# Patient Record
Sex: Female | Born: 1935 | Race: White | Hispanic: No | Smoking: Never smoker
Health system: Southern US, Community
[De-identification: ages and names within clinical notes are randomized; demographics above are authoritative.]

## PROBLEM LIST (undated history)

## (undated) DIAGNOSIS — E039 Hypothyroidism, unspecified: Secondary | ICD-10-CM

## (undated) DIAGNOSIS — C801 Malignant (primary) neoplasm, unspecified: Secondary | ICD-10-CM

## (undated) DIAGNOSIS — I10 Essential (primary) hypertension: Secondary | ICD-10-CM

## (undated) DIAGNOSIS — M1990 Unspecified osteoarthritis, unspecified site: Secondary | ICD-10-CM

## (undated) DIAGNOSIS — E785 Hyperlipidemia, unspecified: Secondary | ICD-10-CM

## (undated) DIAGNOSIS — Z8673 Personal history of transient ischemic attack (TIA), and cerebral infarction without residual deficits: Secondary | ICD-10-CM

## (undated) DIAGNOSIS — I639 Cerebral infarction, unspecified: Secondary | ICD-10-CM

## (undated) DIAGNOSIS — I499 Cardiac arrhythmia, unspecified: Secondary | ICD-10-CM

## (undated) DIAGNOSIS — E119 Type 2 diabetes mellitus without complications: Secondary | ICD-10-CM

## (undated) DIAGNOSIS — E079 Disorder of thyroid, unspecified: Secondary | ICD-10-CM

## (undated) DIAGNOSIS — Z923 Personal history of irradiation: Secondary | ICD-10-CM

## (undated) DIAGNOSIS — T451X5A Adverse effect of antineoplastic and immunosuppressive drugs, initial encounter: Secondary | ICD-10-CM

## (undated) DIAGNOSIS — N183 Chronic kidney disease, stage 3 unspecified: Secondary | ICD-10-CM

## (undated) HISTORY — DX: Malignant (primary) neoplasm, unspecified: C80.1

## (undated) HISTORY — DX: Essential (primary) hypertension: I10

## (undated) HISTORY — DX: Hyperlipidemia, unspecified: E78.5

## (undated) HISTORY — DX: Disorder of thyroid, unspecified: E07.9

## (undated) HISTORY — PX: SQUAMOUS CELL CARCINOMA EXCISION: SHX2433

## (undated) HISTORY — PX: MASTECTOMY: SHX3

## (undated) HISTORY — PX: CHOLECYSTECTOMY: SHX55

## (undated) HISTORY — DX: Type 2 diabetes mellitus without complications: E11.9

## (undated) HISTORY — PX: EYE SURGERY: SHX253

---

## 1998-02-26 ENCOUNTER — Other Ambulatory Visit: Admission: RE | Admit: 1998-02-26 | Discharge: 1998-02-26 | Payer: Self-pay | Admitting: Family Medicine

## 1999-03-14 ENCOUNTER — Other Ambulatory Visit: Admission: RE | Admit: 1999-03-14 | Discharge: 1999-03-14 | Payer: Self-pay | Admitting: Family Medicine

## 2000-04-27 ENCOUNTER — Other Ambulatory Visit: Admission: RE | Admit: 2000-04-27 | Discharge: 2000-04-27 | Payer: Self-pay | Admitting: Family Medicine

## 2001-05-07 ENCOUNTER — Other Ambulatory Visit: Admission: RE | Admit: 2001-05-07 | Discharge: 2001-05-07 | Payer: Self-pay | Admitting: Family Medicine

## 2001-05-31 ENCOUNTER — Ambulatory Visit (HOSPITAL_COMMUNITY): Admission: RE | Admit: 2001-05-31 | Discharge: 2001-05-31 | Payer: Self-pay | Admitting: Oncology

## 2001-05-31 ENCOUNTER — Encounter (HOSPITAL_COMMUNITY): Payer: Self-pay | Admitting: Oncology

## 2001-10-04 ENCOUNTER — Ambulatory Visit (HOSPITAL_COMMUNITY): Admission: RE | Admit: 2001-10-04 | Discharge: 2001-10-04 | Payer: Self-pay | Admitting: Gastroenterology

## 2002-05-19 ENCOUNTER — Other Ambulatory Visit: Admission: RE | Admit: 2002-05-19 | Discharge: 2002-05-19 | Payer: Self-pay | Admitting: Family Medicine

## 2003-05-20 ENCOUNTER — Ambulatory Visit (HOSPITAL_COMMUNITY): Admission: RE | Admit: 2003-05-20 | Discharge: 2003-05-20 | Payer: Self-pay | Admitting: Neurology

## 2003-06-15 ENCOUNTER — Ambulatory Visit (HOSPITAL_COMMUNITY): Admission: RE | Admit: 2003-06-15 | Discharge: 2003-06-15 | Payer: Self-pay | Admitting: Oncology

## 2003-07-23 ENCOUNTER — Other Ambulatory Visit: Admission: RE | Admit: 2003-07-23 | Discharge: 2003-07-23 | Payer: Self-pay | Admitting: Family Medicine

## 2004-01-11 ENCOUNTER — Encounter (HOSPITAL_COMMUNITY): Admission: RE | Admit: 2004-01-11 | Discharge: 2004-04-10 | Payer: Self-pay | Admitting: General Surgery

## 2004-01-25 ENCOUNTER — Ambulatory Visit (HOSPITAL_COMMUNITY): Admission: RE | Admit: 2004-01-25 | Discharge: 2004-01-25 | Payer: Self-pay | Admitting: Oncology

## 2004-01-27 ENCOUNTER — Ambulatory Visit: Admission: RE | Admit: 2004-01-27 | Discharge: 2004-01-27 | Payer: Self-pay | Admitting: Oncology

## 2004-02-04 ENCOUNTER — Encounter (INDEPENDENT_AMBULATORY_CARE_PROVIDER_SITE_OTHER): Payer: Self-pay | Admitting: *Deleted

## 2004-02-05 ENCOUNTER — Inpatient Hospital Stay (HOSPITAL_COMMUNITY): Admission: RE | Admit: 2004-02-05 | Discharge: 2004-02-06 | Payer: Self-pay | Admitting: General Surgery

## 2004-05-23 ENCOUNTER — Inpatient Hospital Stay (HOSPITAL_COMMUNITY): Admission: EM | Admit: 2004-05-23 | Discharge: 2004-05-26 | Payer: Self-pay | Admitting: Emergency Medicine

## 2004-06-01 ENCOUNTER — Ambulatory Visit: Admission: RE | Admit: 2004-06-01 | Discharge: 2004-08-04 | Payer: Self-pay | Admitting: Radiation Oncology

## 2004-06-06 ENCOUNTER — Ambulatory Visit (HOSPITAL_COMMUNITY): Admission: RE | Admit: 2004-06-06 | Discharge: 2004-06-06 | Payer: Self-pay | Admitting: Family Medicine

## 2004-06-28 ENCOUNTER — Encounter: Admission: RE | Admit: 2004-06-28 | Discharge: 2004-06-28 | Payer: Self-pay | Admitting: General Surgery

## 2004-07-12 ENCOUNTER — Other Ambulatory Visit: Admission: RE | Admit: 2004-07-12 | Discharge: 2004-07-12 | Payer: Self-pay | Admitting: General Surgery

## 2004-08-04 ENCOUNTER — Ambulatory Visit: Payer: Self-pay | Admitting: Oncology

## 2004-08-31 ENCOUNTER — Other Ambulatory Visit: Admission: RE | Admit: 2004-08-31 | Discharge: 2004-08-31 | Payer: Self-pay | Admitting: *Deleted

## 2004-09-01 ENCOUNTER — Ambulatory Visit: Admission: RE | Admit: 2004-09-01 | Discharge: 2004-09-01 | Payer: Self-pay | Admitting: Radiation Oncology

## 2004-09-19 ENCOUNTER — Ambulatory Visit: Payer: Self-pay | Admitting: Oncology

## 2004-09-26 ENCOUNTER — Ambulatory Visit (HOSPITAL_COMMUNITY): Admission: RE | Admit: 2004-09-26 | Discharge: 2004-09-26 | Payer: Self-pay | Admitting: Oncology

## 2004-11-11 ENCOUNTER — Ambulatory Visit: Payer: Self-pay | Admitting: Oncology

## 2005-01-11 ENCOUNTER — Ambulatory Visit: Payer: Self-pay | Admitting: Oncology

## 2005-03-17 ENCOUNTER — Ambulatory Visit: Payer: Self-pay | Admitting: Oncology

## 2005-06-16 ENCOUNTER — Ambulatory Visit: Payer: Self-pay | Admitting: Oncology

## 2005-09-15 ENCOUNTER — Ambulatory Visit: Payer: Self-pay | Admitting: Oncology

## 2005-09-15 ENCOUNTER — Other Ambulatory Visit: Admission: RE | Admit: 2005-09-15 | Discharge: 2005-09-15 | Payer: Self-pay | Admitting: Family Medicine

## 2005-09-18 ENCOUNTER — Ambulatory Visit (HOSPITAL_COMMUNITY): Admission: RE | Admit: 2005-09-18 | Discharge: 2005-09-18 | Payer: Self-pay | Admitting: Oncology

## 2005-09-28 ENCOUNTER — Ambulatory Visit (HOSPITAL_COMMUNITY): Admission: RE | Admit: 2005-09-28 | Discharge: 2005-09-28 | Payer: Self-pay | Admitting: Family Medicine

## 2005-12-15 ENCOUNTER — Ambulatory Visit: Payer: Self-pay | Admitting: Oncology

## 2005-12-19 LAB — CBC WITH DIFFERENTIAL/PLATELET
BASO%: 0.4 % (ref 0.0–2.0)
Basophils Absolute: 0 10*3/uL (ref 0.0–0.1)
EOS%: 3.7 % (ref 0.0–7.0)
Eosinophils Absolute: 0.2 10*3/uL (ref 0.0–0.5)
HCT: 33.4 % — ABNORMAL LOW (ref 34.8–46.6)
HGB: 11.6 g/dL (ref 11.6–15.9)
LYMPH%: 23 % (ref 14.0–48.0)
MCH: 31.4 pg (ref 26.0–34.0)
MCHC: 34.7 g/dL (ref 32.0–36.0)
MCV: 90.6 fL (ref 81.0–101.0)
MONO#: 0.7 10*3/uL (ref 0.1–0.9)
MONO%: 12.2 % (ref 0.0–13.0)
NEUT#: 3.3 10*3/uL (ref 1.5–6.5)
NEUT%: 60.7 % (ref 39.6–76.8)
Platelets: 304 10*3/uL (ref 145–400)
RBC: 3.69 10*6/uL — ABNORMAL LOW (ref 3.70–5.32)
RDW: 14.6 % — ABNORMAL HIGH (ref 11.3–14.5)
WBC: 5.5 10*3/uL (ref 3.9–10.0)
lymph#: 1.3 10*3/uL (ref 0.9–3.3)

## 2005-12-19 LAB — COMPREHENSIVE METABOLIC PANEL
ALT: 12 U/L (ref 0–40)
AST: 16 U/L (ref 0–37)
Albumin: 3.8 g/dL (ref 3.5–5.2)
Alkaline Phosphatase: 53 U/L (ref 39–117)
BUN: 16 mg/dL (ref 6–23)
CO2: 22 mEq/L (ref 19–32)
Calcium: 9 mg/dL (ref 8.4–10.5)
Chloride: 105 mEq/L (ref 96–112)
Creatinine, Ser: 1 mg/dL (ref 0.4–1.2)
Glucose, Bld: 81 mg/dL (ref 70–99)
Potassium: 4.3 mEq/L (ref 3.5–5.3)
Sodium: 145 mEq/L (ref 135–145)
Total Bilirubin: 0.4 mg/dL (ref 0.3–1.2)
Total Protein: 7 g/dL (ref 6.0–8.3)

## 2005-12-19 LAB — IRON AND TIBC
%SAT: 16 % — ABNORMAL LOW (ref 20–55)
Iron: 44 ug/dL (ref 42–145)
TIBC: 279 ug/dL (ref 250–470)
UIBC: 235 ug/dL

## 2005-12-19 LAB — LACTATE DEHYDROGENASE: LDH: 153 U/L (ref 94–250)

## 2005-12-19 LAB — FERRITIN: Ferritin: 40 ng/mL (ref 10–291)

## 2006-01-02 LAB — FECAL OCCULT BLOOD, GUAIAC: Occult Blood: NEGATIVE

## 2006-01-19 LAB — CBC WITH DIFFERENTIAL/PLATELET
BASO%: 0.8 % (ref 0.0–2.0)
Basophils Absolute: 0 10*3/uL (ref 0.0–0.1)
EOS%: 3.6 % (ref 0.0–7.0)
Eosinophils Absolute: 0.2 10*3/uL (ref 0.0–0.5)
HCT: 36.6 % (ref 34.8–46.6)
HGB: 12.4 g/dL (ref 11.6–15.9)
LYMPH%: 19.7 % (ref 14.0–48.0)
MCH: 30.6 pg (ref 26.0–34.0)
MCHC: 33.8 g/dL (ref 32.0–36.0)
MCV: 90.8 fL (ref 81.0–101.0)
MONO#: 0.7 10*3/uL (ref 0.1–0.9)
MONO%: 11.2 % (ref 0.0–13.0)
NEUT#: 4.2 10*3/uL (ref 1.5–6.5)
NEUT%: 64.7 % (ref 39.6–76.8)
Platelets: 301 10*3/uL (ref 145–400)
RBC: 4.04 10*6/uL (ref 3.70–5.32)
RDW: 14.6 % — ABNORMAL HIGH (ref 11.3–14.5)
WBC: 6.5 10*3/uL (ref 3.9–10.0)
lymph#: 1.3 10*3/uL (ref 0.9–3.3)

## 2006-02-28 ENCOUNTER — Encounter: Admission: RE | Admit: 2006-02-28 | Discharge: 2006-03-14 | Payer: Self-pay | Admitting: Oncology

## 2006-03-23 ENCOUNTER — Ambulatory Visit: Payer: Self-pay | Admitting: Oncology

## 2006-03-30 LAB — COMPREHENSIVE METABOLIC PANEL
ALT: 14 U/L (ref 0–40)
AST: 16 U/L (ref 0–37)
Albumin: 4.1 g/dL (ref 3.5–5.2)
Alkaline Phosphatase: 56 U/L (ref 39–117)
BUN: 19 mg/dL (ref 6–23)
CO2: 25 mEq/L (ref 19–32)
Calcium: 9.5 mg/dL (ref 8.4–10.5)
Chloride: 108 mEq/L (ref 96–112)
Creatinine, Ser: 0.95 mg/dL (ref 0.40–1.20)
Glucose, Bld: 82 mg/dL (ref 70–99)
Potassium: 4.8 mEq/L (ref 3.5–5.3)
Sodium: 142 mEq/L (ref 135–145)
Total Bilirubin: 0.5 mg/dL (ref 0.3–1.2)
Total Protein: 7 g/dL (ref 6.0–8.3)

## 2006-03-30 LAB — CBC WITH DIFFERENTIAL/PLATELET
BASO%: 0.5 % (ref 0.0–2.0)
Basophils Absolute: 0 10*3/uL (ref 0.0–0.1)
EOS%: 2.4 % (ref 0.0–7.0)
Eosinophils Absolute: 0.1 10*3/uL (ref 0.0–0.5)
HCT: 36.2 % (ref 34.8–46.6)
HGB: 12.5 g/dL (ref 11.6–15.9)
LYMPH%: 21.6 % (ref 14.0–48.0)
MCH: 31.8 pg (ref 26.0–34.0)
MCHC: 34.6 g/dL (ref 32.0–36.0)
MCV: 91.9 fL (ref 81.0–101.0)
MONO#: 0.7 10*3/uL (ref 0.1–0.9)
MONO%: 11 % (ref 0.0–13.0)
NEUT#: 4.1 10*3/uL (ref 1.5–6.5)
NEUT%: 64.5 % (ref 39.6–76.8)
Platelets: 282 10*3/uL (ref 145–400)
RBC: 3.94 10*6/uL (ref 3.70–5.32)
RDW: 14.6 % — ABNORMAL HIGH (ref 11.3–14.5)
WBC: 6.3 10*3/uL (ref 3.9–10.0)
lymph#: 1.4 10*3/uL (ref 0.9–3.3)

## 2006-03-30 LAB — IRON AND TIBC
%SAT: 29 % (ref 20–55)
Iron: 98 ug/dL (ref 42–145)
TIBC: 333 ug/dL (ref 250–470)
UIBC: 235 ug/dL

## 2006-03-30 LAB — FERRITIN: Ferritin: 71 ng/mL (ref 10–291)

## 2006-03-30 LAB — LACTATE DEHYDROGENASE: LDH: 125 U/L (ref 94–250)

## 2006-06-19 ENCOUNTER — Ambulatory Visit: Payer: Self-pay | Admitting: Oncology

## 2006-06-21 LAB — CBC WITH DIFFERENTIAL/PLATELET
BASO%: 0.2 % (ref 0.0–2.0)
Basophils Absolute: 0 10*3/uL (ref 0.0–0.1)
EOS%: 2.8 % (ref 0.0–7.0)
Eosinophils Absolute: 0.2 10*3/uL (ref 0.0–0.5)
HCT: 37.3 % (ref 34.8–46.6)
HGB: 12.9 g/dL (ref 11.6–15.9)
LYMPH%: 15.3 % (ref 14.0–48.0)
MCH: 32.2 pg (ref 26.0–34.0)
MCHC: 34.5 g/dL (ref 32.0–36.0)
MCV: 93.1 fL (ref 81.0–101.0)
MONO#: 0.8 10*3/uL (ref 0.1–0.9)
MONO%: 8.8 % (ref 0.0–13.0)
NEUT#: 6.4 10*3/uL (ref 1.5–6.5)
NEUT%: 72.9 % (ref 39.6–76.8)
Platelets: 323 10*3/uL (ref 145–400)
RBC: 4.01 10*6/uL (ref 3.70–5.32)
RDW: 13.6 % (ref 11.3–14.5)
WBC: 8.7 10*3/uL (ref 3.9–10.0)
lymph#: 1.3 10*3/uL (ref 0.9–3.3)

## 2006-06-21 LAB — COMPREHENSIVE METABOLIC PANEL WITH GFR
ALT: 13 U/L (ref 0–35)
AST: 16 U/L (ref 0–37)
Albumin: 3.9 g/dL (ref 3.5–5.2)
Alkaline Phosphatase: 55 U/L (ref 39–117)
BUN: 17 mg/dL (ref 6–23)
CO2: 28 meq/L (ref 19–32)
Calcium: 9.8 mg/dL (ref 8.4–10.5)
Chloride: 102 meq/L (ref 96–112)
Creatinine, Ser: 0.97 mg/dL (ref 0.40–1.20)
Glucose, Bld: 95 mg/dL (ref 70–99)
Potassium: 4 meq/L (ref 3.5–5.3)
Sodium: 138 meq/L (ref 135–145)
Total Bilirubin: 0.5 mg/dL (ref 0.3–1.2)
Total Protein: 7 g/dL (ref 6.0–8.3)

## 2006-06-21 LAB — LACTATE DEHYDROGENASE: LDH: 146 U/L (ref 94–250)

## 2006-07-30 ENCOUNTER — Emergency Department (HOSPITAL_COMMUNITY): Admission: EM | Admit: 2006-07-30 | Discharge: 2006-07-30 | Payer: Self-pay | Admitting: Emergency Medicine

## 2006-09-17 ENCOUNTER — Ambulatory Visit: Payer: Self-pay | Admitting: Oncology

## 2006-09-20 ENCOUNTER — Ambulatory Visit (HOSPITAL_COMMUNITY): Admission: RE | Admit: 2006-09-20 | Discharge: 2006-09-20 | Payer: Self-pay | Admitting: Oncology

## 2006-09-20 LAB — COMPREHENSIVE METABOLIC PANEL
ALT: 12 U/L (ref 0–35)
AST: 17 U/L (ref 0–37)
Albumin: 4 g/dL (ref 3.5–5.2)
Alkaline Phosphatase: 57 U/L (ref 39–117)
BUN: 13 mg/dL (ref 6–23)
CO2: 25 mEq/L (ref 19–32)
Calcium: 9.4 mg/dL (ref 8.4–10.5)
Chloride: 107 mEq/L (ref 96–112)
Creatinine, Ser: 0.96 mg/dL (ref 0.40–1.20)
Glucose, Bld: 140 mg/dL — ABNORMAL HIGH (ref 70–99)
Potassium: 3.9 mEq/L (ref 3.5–5.3)
Sodium: 141 mEq/L (ref 135–145)
Total Bilirubin: 0.5 mg/dL (ref 0.3–1.2)
Total Protein: 7 g/dL (ref 6.0–8.3)

## 2006-09-20 LAB — CBC WITH DIFFERENTIAL/PLATELET
BASO%: 0.7 % (ref 0.0–2.0)
Basophils Absolute: 0 10*3/uL (ref 0.0–0.1)
EOS%: 2.9 % (ref 0.0–7.0)
Eosinophils Absolute: 0.2 10*3/uL (ref 0.0–0.5)
HCT: 35.4 % (ref 34.8–46.6)
HGB: 12.7 g/dL (ref 11.6–15.9)
LYMPH%: 19.1 % (ref 14.0–48.0)
MCH: 32.1 pg (ref 26.0–34.0)
MCHC: 35.8 g/dL (ref 32.0–36.0)
MCV: 89.5 fL (ref 81.0–101.0)
MONO#: 0.6 10*3/uL (ref 0.1–0.9)
MONO%: 10.1 % (ref 0.0–13.0)
NEUT#: 4 10*3/uL (ref 1.5–6.5)
NEUT%: 67.2 % (ref 39.6–76.8)
Platelets: 313 10*3/uL (ref 145–400)
RBC: 3.95 10*6/uL (ref 3.70–5.32)
RDW: 13.4 % (ref 11.3–14.5)
WBC: 5.9 10*3/uL (ref 3.9–10.0)
lymph#: 1.1 10*3/uL (ref 0.9–3.3)

## 2006-09-20 LAB — LACTATE DEHYDROGENASE: LDH: 132 U/L (ref 94–250)

## 2006-12-17 ENCOUNTER — Ambulatory Visit: Payer: Self-pay | Admitting: Oncology

## 2006-12-19 LAB — CBC WITH DIFFERENTIAL/PLATELET
BASO%: 0.7 % (ref 0.0–2.0)
Basophils Absolute: 0 10*3/uL (ref 0.0–0.1)
EOS%: 2.8 % (ref 0.0–7.0)
Eosinophils Absolute: 0.2 10*3/uL (ref 0.0–0.5)
HCT: 34.4 % — ABNORMAL LOW (ref 34.8–46.6)
HGB: 12.1 g/dL (ref 11.6–15.9)
LYMPH%: 23 % (ref 14.0–48.0)
MCH: 31.4 pg (ref 26.0–34.0)
MCHC: 35.1 g/dL (ref 32.0–36.0)
MCV: 89.4 fL (ref 81.0–101.0)
MONO#: 0.5 10*3/uL (ref 0.1–0.9)
MONO%: 8.3 % (ref 0.0–13.0)
NEUT#: 4.3 10*3/uL (ref 1.5–6.5)
NEUT%: 65.2 % (ref 39.6–76.8)
Platelets: 272 10*3/uL (ref 145–400)
RBC: 3.84 10*6/uL (ref 3.70–5.32)
RDW: 13.9 % (ref 11.3–14.5)
WBC: 6.6 10*3/uL (ref 3.9–10.0)
lymph#: 1.5 10*3/uL (ref 0.9–3.3)

## 2006-12-19 LAB — COMPREHENSIVE METABOLIC PANEL
ALT: 11 U/L (ref 0–35)
AST: 15 U/L (ref 0–37)
Albumin: 4 g/dL (ref 3.5–5.2)
Alkaline Phosphatase: 60 U/L (ref 39–117)
BUN: 19 mg/dL (ref 6–23)
CO2: 24 mEq/L (ref 19–32)
Calcium: 9.2 mg/dL (ref 8.4–10.5)
Chloride: 108 mEq/L (ref 96–112)
Creatinine, Ser: 0.87 mg/dL (ref 0.40–1.20)
Glucose, Bld: 107 mg/dL — ABNORMAL HIGH (ref 70–99)
Potassium: 4.4 mEq/L (ref 3.5–5.3)
Sodium: 140 mEq/L (ref 135–145)
Total Bilirubin: 0.4 mg/dL (ref 0.3–1.2)
Total Protein: 6.8 g/dL (ref 6.0–8.3)

## 2006-12-19 LAB — LACTATE DEHYDROGENASE: LDH: 123 U/L (ref 94–250)

## 2007-03-07 ENCOUNTER — Ambulatory Visit: Payer: Self-pay | Admitting: Vascular Surgery

## 2007-03-18 ENCOUNTER — Ambulatory Visit: Payer: Self-pay | Admitting: Oncology

## 2007-03-21 LAB — CBC WITH DIFFERENTIAL/PLATELET
BASO%: 0.7 % (ref 0.0–2.0)
Basophils Absolute: 0.1 10*3/uL (ref 0.0–0.1)
EOS%: 3.4 % (ref 0.0–7.0)
Eosinophils Absolute: 0.2 10*3/uL (ref 0.0–0.5)
HCT: 33.6 % — ABNORMAL LOW (ref 34.8–46.6)
HGB: 12.1 g/dL (ref 11.6–15.9)
LYMPH%: 22.1 % (ref 14.0–48.0)
MCH: 32.3 pg (ref 26.0–34.0)
MCHC: 36.1 g/dL — ABNORMAL HIGH (ref 32.0–36.0)
MCV: 89.4 fL (ref 81.0–101.0)
MONO#: 0.6 10*3/uL (ref 0.1–0.9)
MONO%: 9.3 % (ref 0.0–13.0)
NEUT#: 4.4 10*3/uL (ref 1.5–6.5)
NEUT%: 64.5 % (ref 39.6–76.8)
Platelets: 302 10*3/uL (ref 145–400)
RBC: 3.76 10*6/uL (ref 3.70–5.32)
RDW: 14.1 % (ref 11.3–14.5)
WBC: 6.8 10*3/uL (ref 3.9–10.0)
lymph#: 1.5 10*3/uL (ref 0.9–3.3)

## 2007-03-21 LAB — COMPREHENSIVE METABOLIC PANEL
ALT: 13 U/L (ref 0–35)
AST: 18 U/L (ref 0–37)
Albumin: 4.1 g/dL (ref 3.5–5.2)
Alkaline Phosphatase: 54 U/L (ref 39–117)
BUN: 17 mg/dL (ref 6–23)
CO2: 25 mEq/L (ref 19–32)
Calcium: 9.5 mg/dL (ref 8.4–10.5)
Chloride: 107 mEq/L (ref 96–112)
Creatinine, Ser: 1 mg/dL (ref 0.40–1.20)
Glucose, Bld: 124 mg/dL — ABNORMAL HIGH (ref 70–99)
Potassium: 4.1 mEq/L (ref 3.5–5.3)
Sodium: 142 mEq/L (ref 135–145)
Total Bilirubin: 0.4 mg/dL (ref 0.3–1.2)
Total Protein: 6.9 g/dL (ref 6.0–8.3)

## 2007-03-21 LAB — LACTATE DEHYDROGENASE: LDH: 152 U/L (ref 94–250)

## 2007-06-04 ENCOUNTER — Ambulatory Visit: Payer: Self-pay | Admitting: Vascular Surgery

## 2007-06-17 ENCOUNTER — Ambulatory Visit: Payer: Self-pay | Admitting: Vascular Surgery

## 2007-06-24 ENCOUNTER — Ambulatory Visit: Payer: Self-pay | Admitting: Vascular Surgery

## 2007-07-19 ENCOUNTER — Ambulatory Visit: Payer: Self-pay | Admitting: Oncology

## 2007-07-23 LAB — COMPREHENSIVE METABOLIC PANEL
ALT: 13 U/L (ref 0–35)
AST: 16 U/L (ref 0–37)
Albumin: 4 g/dL (ref 3.5–5.2)
Alkaline Phosphatase: 66 U/L (ref 39–117)
BUN: 20 mg/dL (ref 6–23)
CO2: 23 mEq/L (ref 19–32)
Calcium: 9.5 mg/dL (ref 8.4–10.5)
Chloride: 107 mEq/L (ref 96–112)
Creatinine, Ser: 0.99 mg/dL (ref 0.40–1.20)
Glucose, Bld: 157 mg/dL — ABNORMAL HIGH (ref 70–99)
Potassium: 4.5 mEq/L (ref 3.5–5.3)
Sodium: 141 mEq/L (ref 135–145)
Total Bilirubin: 0.3 mg/dL (ref 0.3–1.2)
Total Protein: 7.2 g/dL (ref 6.0–8.3)

## 2007-07-23 LAB — CBC WITH DIFFERENTIAL/PLATELET
BASO%: 0.5 % (ref 0.0–2.0)
Basophils Absolute: 0 10*3/uL (ref 0.0–0.1)
EOS%: 2.3 % (ref 0.0–7.0)
Eosinophils Absolute: 0.2 10*3/uL (ref 0.0–0.5)
HCT: 35 % (ref 34.8–46.6)
HGB: 12.1 g/dL (ref 11.6–15.9)
LYMPH%: 22.4 % (ref 14.0–48.0)
MCH: 30.6 pg (ref 26.0–34.0)
MCHC: 34.5 g/dL (ref 32.0–36.0)
MCV: 88.8 fL (ref 81.0–101.0)
MONO#: 0.6 10*3/uL (ref 0.1–0.9)
MONO%: 7.6 % (ref 0.0–13.0)
NEUT#: 5.5 10*3/uL (ref 1.5–6.5)
NEUT%: 67.2 % (ref 39.6–76.8)
Platelets: 361 10*3/uL (ref 145–400)
RBC: 3.94 10*6/uL (ref 3.70–5.32)
RDW: 14.3 % (ref 11.3–14.5)
WBC: 8.1 10*3/uL (ref 3.9–10.0)
lymph#: 1.8 10*3/uL (ref 0.9–3.3)

## 2007-07-23 LAB — LACTATE DEHYDROGENASE: LDH: 136 U/L (ref 94–250)

## 2007-09-10 ENCOUNTER — Ambulatory Visit: Payer: Self-pay | Admitting: Vascular Surgery

## 2007-11-19 ENCOUNTER — Ambulatory Visit: Payer: Self-pay | Admitting: Oncology

## 2007-11-21 LAB — COMPREHENSIVE METABOLIC PANEL
ALT: 14 U/L (ref 0–35)
AST: 15 U/L (ref 0–37)
Albumin: 3.9 g/dL (ref 3.5–5.2)
Alkaline Phosphatase: 57 U/L (ref 39–117)
BUN: 11 mg/dL (ref 6–23)
CO2: 24 mEq/L (ref 19–32)
Calcium: 9 mg/dL (ref 8.4–10.5)
Chloride: 107 mEq/L (ref 96–112)
Creatinine, Ser: 1.1 mg/dL (ref 0.40–1.20)
Glucose, Bld: 171 mg/dL — ABNORMAL HIGH (ref 70–99)
Potassium: 3.9 mEq/L (ref 3.5–5.3)
Sodium: 141 mEq/L (ref 135–145)
Total Bilirubin: 0.4 mg/dL (ref 0.3–1.2)
Total Protein: 6.8 g/dL (ref 6.0–8.3)

## 2007-11-21 LAB — CBC WITH DIFFERENTIAL/PLATELET
BASO%: 0.2 % (ref 0.0–2.0)
Basophils Absolute: 0 10*3/uL (ref 0.0–0.1)
EOS%: 2 % (ref 0.0–7.0)
Eosinophils Absolute: 0.2 10*3/uL (ref 0.0–0.5)
HCT: 33.9 % — ABNORMAL LOW (ref 34.8–46.6)
HGB: 11.9 g/dL (ref 11.6–15.9)
LYMPH%: 17.7 % (ref 14.0–48.0)
MCH: 31.1 pg (ref 26.0–34.0)
MCHC: 35.3 g/dL (ref 32.0–36.0)
MCV: 88.3 fL (ref 81.0–101.0)
MONO#: 0.8 10*3/uL (ref 0.1–0.9)
MONO%: 8.4 % (ref 0.0–13.0)
NEUT#: 6.4 10*3/uL (ref 1.5–6.5)
NEUT%: 71.7 % (ref 39.6–76.8)
Platelets: 306 10*3/uL (ref 145–400)
RBC: 3.84 10*6/uL (ref 3.70–5.32)
RDW: 15.2 % — ABNORMAL HIGH (ref 11.3–14.5)
WBC: 9 10*3/uL (ref 3.9–10.0)
lymph#: 1.6 10*3/uL (ref 0.9–3.3)

## 2007-11-21 LAB — LACTATE DEHYDROGENASE: LDH: 142 U/L (ref 94–250)

## 2008-03-19 ENCOUNTER — Ambulatory Visit: Payer: Self-pay | Admitting: Oncology

## 2008-03-23 LAB — CBC WITH DIFFERENTIAL/PLATELET
BASO%: 1.1 % (ref 0.0–2.0)
Basophils Absolute: 0.1 10*3/uL (ref 0.0–0.1)
EOS%: 3.6 % (ref 0.0–7.0)
Eosinophils Absolute: 0.2 10*3/uL (ref 0.0–0.5)
HCT: 34.9 % (ref 34.8–46.6)
HGB: 12.2 g/dL (ref 11.6–15.9)
LYMPH%: 27.8 % (ref 14.0–48.0)
MCH: 31 pg (ref 26.0–34.0)
MCHC: 34.9 g/dL (ref 32.0–36.0)
MCV: 88.8 fL (ref 81.0–101.0)
MONO#: 0.6 10*3/uL (ref 0.1–0.9)
MONO%: 9.9 % (ref 0.0–13.0)
NEUT#: 3.7 10*3/uL (ref 1.5–6.5)
NEUT%: 57.6 % (ref 39.6–76.8)
Platelets: 260 10*3/uL (ref 145–400)
RBC: 3.93 10*6/uL (ref 3.70–5.32)
RDW: 14.5 % (ref 11.3–14.5)
WBC: 6.4 10*3/uL (ref 3.9–10.0)
lymph#: 1.8 10*3/uL (ref 0.9–3.3)

## 2008-03-23 LAB — COMPREHENSIVE METABOLIC PANEL
ALT: 12 U/L (ref 0–35)
AST: 15 U/L (ref 0–37)
Albumin: 4 g/dL (ref 3.5–5.2)
Alkaline Phosphatase: 66 U/L (ref 39–117)
BUN: 20 mg/dL (ref 6–23)
CO2: 22 mEq/L (ref 19–32)
Calcium: 9.6 mg/dL (ref 8.4–10.5)
Chloride: 107 mEq/L (ref 96–112)
Creatinine, Ser: 1.11 mg/dL (ref 0.40–1.20)
Glucose, Bld: 182 mg/dL — ABNORMAL HIGH (ref 70–99)
Potassium: 4 mEq/L (ref 3.5–5.3)
Sodium: 138 mEq/L (ref 135–145)
Total Bilirubin: 0.4 mg/dL (ref 0.3–1.2)
Total Protein: 7 g/dL (ref 6.0–8.3)

## 2008-03-23 LAB — LACTATE DEHYDROGENASE: LDH: 133 U/L (ref 94–250)

## 2008-07-21 ENCOUNTER — Ambulatory Visit: Payer: Self-pay | Admitting: Oncology

## 2008-07-23 LAB — CBC WITH DIFFERENTIAL/PLATELET
BASO%: 0.7 % (ref 0.0–2.0)
Basophils Absolute: 0 10*3/uL (ref 0.0–0.1)
EOS%: 2.9 % (ref 0.0–7.0)
Eosinophils Absolute: 0.2 10*3/uL (ref 0.0–0.5)
HCT: 36.6 % (ref 34.8–46.6)
HGB: 12.5 g/dL (ref 11.6–15.9)
LYMPH%: 31.6 % (ref 14.0–48.0)
MCH: 30.8 pg (ref 26.0–34.0)
MCHC: 34.2 g/dL (ref 32.0–36.0)
MCV: 90.1 fL (ref 81.0–101.0)
MONO#: 0.6 10*3/uL (ref 0.1–0.9)
MONO%: 9.3 % (ref 0.0–13.0)
NEUT#: 3.8 10*3/uL (ref 1.5–6.5)
NEUT%: 55.5 % (ref 39.6–76.8)
Platelets: 300 10*3/uL (ref 145–400)
RBC: 4.06 10*6/uL (ref 3.70–5.32)
RDW: 14.2 % (ref 11.3–14.5)
WBC: 6.9 10*3/uL (ref 3.9–10.0)
lymph#: 2.2 10*3/uL (ref 0.9–3.3)

## 2008-07-24 LAB — COMPREHENSIVE METABOLIC PANEL
ALT: 14 U/L (ref 0–35)
AST: 15 U/L (ref 0–37)
Albumin: 4.2 g/dL (ref 3.5–5.2)
Alkaline Phosphatase: 62 U/L (ref 39–117)
BUN: 20 mg/dL (ref 6–23)
CO2: 24 mEq/L (ref 19–32)
Calcium: 9.6 mg/dL (ref 8.4–10.5)
Chloride: 106 mEq/L (ref 96–112)
Creatinine, Ser: 1 mg/dL (ref 0.40–1.20)
Glucose, Bld: 160 mg/dL — ABNORMAL HIGH (ref 70–99)
Potassium: 4.1 mEq/L (ref 3.5–5.3)
Sodium: 140 mEq/L (ref 135–145)
Total Bilirubin: 0.4 mg/dL (ref 0.3–1.2)
Total Protein: 7.5 g/dL (ref 6.0–8.3)

## 2008-07-24 LAB — LACTATE DEHYDROGENASE: LDH: 142 U/L (ref 94–250)

## 2008-09-29 ENCOUNTER — Ambulatory Visit (HOSPITAL_COMMUNITY): Admission: RE | Admit: 2008-09-29 | Discharge: 2008-09-29 | Payer: Self-pay | Admitting: Oncology

## 2008-11-19 ENCOUNTER — Ambulatory Visit: Payer: Self-pay | Admitting: Oncology

## 2008-11-23 LAB — CBC WITH DIFFERENTIAL/PLATELET
BASO%: 0.6 % (ref 0.0–2.0)
Basophils Absolute: 0 10*3/uL (ref 0.0–0.1)
EOS%: 3.9 % (ref 0.0–7.0)
Eosinophils Absolute: 0.3 10*3/uL (ref 0.0–0.5)
HCT: 36 % (ref 34.8–46.6)
HGB: 12.3 g/dL (ref 11.6–15.9)
LYMPH%: 25.8 % (ref 14.0–49.7)
MCH: 30.6 pg (ref 25.1–34.0)
MCHC: 34.1 g/dL (ref 31.5–36.0)
MCV: 89.8 fL (ref 79.5–101.0)
MONO#: 0.6 10*3/uL (ref 0.1–0.9)
MONO%: 8.8 % (ref 0.0–14.0)
NEUT#: 4.4 10*3/uL (ref 1.5–6.5)
NEUT%: 60.9 % (ref 38.4–76.8)
Platelets: 275 10*3/uL (ref 145–400)
RBC: 4.01 10*6/uL (ref 3.70–5.45)
RDW: 14.5 % (ref 11.2–14.5)
WBC: 7.2 10*3/uL (ref 3.9–10.3)
lymph#: 1.9 10*3/uL (ref 0.9–3.3)

## 2008-11-23 LAB — COMPREHENSIVE METABOLIC PANEL
ALT: 10 U/L (ref 0–35)
AST: 14 U/L (ref 0–37)
Albumin: 3.9 g/dL (ref 3.5–5.2)
Alkaline Phosphatase: 59 U/L (ref 39–117)
BUN: 20 mg/dL (ref 6–23)
CO2: 24 mEq/L (ref 19–32)
Calcium: 9.8 mg/dL (ref 8.4–10.5)
Chloride: 106 mEq/L (ref 96–112)
Creatinine, Ser: 1.16 mg/dL (ref 0.40–1.20)
Glucose, Bld: 146 mg/dL — ABNORMAL HIGH (ref 70–99)
Potassium: 4.1 mEq/L (ref 3.5–5.3)
Sodium: 139 mEq/L (ref 135–145)
Total Bilirubin: 0.3 mg/dL (ref 0.3–1.2)
Total Protein: 7 g/dL (ref 6.0–8.3)

## 2008-11-23 LAB — LACTATE DEHYDROGENASE: LDH: 122 U/L (ref 94–250)

## 2009-03-23 ENCOUNTER — Ambulatory Visit: Payer: Self-pay | Admitting: Oncology

## 2009-03-25 LAB — COMPREHENSIVE METABOLIC PANEL
ALT: 11 U/L (ref 0–35)
AST: 14 U/L (ref 0–37)
Albumin: 4 g/dL (ref 3.5–5.2)
Alkaline Phosphatase: 56 U/L (ref 39–117)
BUN: 21 mg/dL (ref 6–23)
CO2: 23 mEq/L (ref 19–32)
Calcium: 9.3 mg/dL (ref 8.4–10.5)
Chloride: 106 mEq/L (ref 96–112)
Creatinine, Ser: 1.06 mg/dL (ref 0.40–1.20)
Glucose, Bld: 132 mg/dL — ABNORMAL HIGH (ref 70–99)
Potassium: 4.1 mEq/L (ref 3.5–5.3)
Sodium: 140 mEq/L (ref 135–145)
Total Bilirubin: 0.4 mg/dL (ref 0.3–1.2)
Total Protein: 6.9 g/dL (ref 6.0–8.3)

## 2009-03-25 LAB — CBC WITH DIFFERENTIAL/PLATELET
BASO%: 0.6 % (ref 0.0–2.0)
Basophils Absolute: 0 10*3/uL (ref 0.0–0.1)
EOS%: 4.1 % (ref 0.0–7.0)
Eosinophils Absolute: 0.3 10*3/uL (ref 0.0–0.5)
HCT: 35.4 % (ref 34.8–46.6)
HGB: 12.1 g/dL (ref 11.6–15.9)
LYMPH%: 23 % (ref 14.0–49.7)
MCH: 31.1 pg (ref 25.1–34.0)
MCHC: 34 g/dL (ref 31.5–36.0)
MCV: 91.4 fL (ref 79.5–101.0)
MONO#: 0.7 10*3/uL (ref 0.1–0.9)
MONO%: 9.3 % (ref 0.0–14.0)
NEUT#: 4.6 10*3/uL (ref 1.5–6.5)
NEUT%: 63 % (ref 38.4–76.8)
Platelets: 285 10*3/uL (ref 145–400)
RBC: 3.88 10*6/uL (ref 3.70–5.45)
RDW: 14.5 % (ref 11.2–14.5)
WBC: 7.3 10*3/uL (ref 3.9–10.3)
lymph#: 1.7 10*3/uL (ref 0.9–3.3)

## 2009-03-25 LAB — LACTATE DEHYDROGENASE: LDH: 129 U/L (ref 94–250)

## 2009-07-20 ENCOUNTER — Ambulatory Visit: Payer: Self-pay | Admitting: Oncology

## 2009-07-22 LAB — COMPREHENSIVE METABOLIC PANEL
ALT: 18 U/L (ref 0–35)
AST: 18 U/L (ref 0–37)
Albumin: 4.1 g/dL (ref 3.5–5.2)
Alkaline Phosphatase: 55 U/L (ref 39–117)
BUN: 20 mg/dL (ref 6–23)
CO2: 22 mEq/L (ref 19–32)
Calcium: 9.6 mg/dL (ref 8.4–10.5)
Chloride: 105 mEq/L (ref 96–112)
Creatinine, Ser: 1 mg/dL (ref 0.40–1.20)
Glucose, Bld: 95 mg/dL (ref 70–99)
Potassium: 4.3 mEq/L (ref 3.5–5.3)
Sodium: 138 mEq/L (ref 135–145)
Total Bilirubin: 0.4 mg/dL (ref 0.3–1.2)
Total Protein: 7.3 g/dL (ref 6.0–8.3)

## 2009-07-22 LAB — CBC WITH DIFFERENTIAL/PLATELET
BASO%: 0.7 % (ref 0.0–2.0)
Basophils Absolute: 0.1 10*3/uL (ref 0.0–0.1)
EOS%: 3.4 % (ref 0.0–7.0)
Eosinophils Absolute: 0.3 10*3/uL (ref 0.0–0.5)
HCT: 36 % (ref 34.8–46.6)
HGB: 12.2 g/dL (ref 11.6–15.9)
LYMPH%: 25.1 % (ref 14.0–49.7)
MCH: 31.3 pg (ref 25.1–34.0)
MCHC: 33.8 g/dL (ref 31.5–36.0)
MCV: 92.4 fL (ref 79.5–101.0)
MONO#: 0.6 10*3/uL (ref 0.1–0.9)
MONO%: 7.6 % (ref 0.0–14.0)
NEUT#: 5.1 10*3/uL (ref 1.5–6.5)
NEUT%: 63.2 % (ref 38.4–76.8)
Platelets: 330 10*3/uL (ref 145–400)
RBC: 3.9 10*6/uL (ref 3.70–5.45)
RDW: 14 % (ref 11.2–14.5)
WBC: 8.1 10*3/uL (ref 3.9–10.3)
lymph#: 2 10*3/uL (ref 0.9–3.3)

## 2009-07-22 LAB — LACTATE DEHYDROGENASE: LDH: 146 U/L (ref 94–250)

## 2009-09-30 ENCOUNTER — Ambulatory Visit (HOSPITAL_COMMUNITY): Admission: RE | Admit: 2009-09-30 | Discharge: 2009-09-30 | Payer: Self-pay | Admitting: Oncology

## 2009-11-17 ENCOUNTER — Ambulatory Visit: Payer: Self-pay | Admitting: Oncology

## 2009-11-19 LAB — CBC WITH DIFFERENTIAL/PLATELET
BASO%: 0.9 % (ref 0.0–2.0)
Basophils Absolute: 0.1 10*3/uL (ref 0.0–0.1)
EOS%: 3.4 % (ref 0.0–7.0)
Eosinophils Absolute: 0.2 10*3/uL (ref 0.0–0.5)
HCT: 36 % (ref 34.8–46.6)
HGB: 12.5 g/dL (ref 11.6–15.9)
LYMPH%: 24 % (ref 14.0–49.7)
MCH: 31.8 pg (ref 25.1–34.0)
MCHC: 34.7 g/dL (ref 31.5–36.0)
MCV: 91.7 fL (ref 79.5–101.0)
MONO#: 0.7 10*3/uL (ref 0.1–0.9)
MONO%: 9.9 % (ref 0.0–14.0)
NEUT#: 4.5 10*3/uL (ref 1.5–6.5)
NEUT%: 61.8 % (ref 38.4–76.8)
Platelets: 276 10*3/uL (ref 145–400)
RBC: 3.93 10*6/uL (ref 3.70–5.45)
RDW: 14 % (ref 11.2–14.5)
WBC: 7.2 10*3/uL (ref 3.9–10.3)
lymph#: 1.7 10*3/uL (ref 0.9–3.3)

## 2009-11-19 LAB — COMPREHENSIVE METABOLIC PANEL
ALT: 12 U/L (ref 0–35)
AST: 14 U/L (ref 0–37)
Albumin: 4.1 g/dL (ref 3.5–5.2)
Alkaline Phosphatase: 58 U/L (ref 39–117)
BUN: 15 mg/dL (ref 6–23)
CO2: 22 mEq/L (ref 19–32)
Calcium: 9 mg/dL (ref 8.4–10.5)
Chloride: 109 mEq/L (ref 96–112)
Creatinine, Ser: 1.15 mg/dL (ref 0.40–1.20)
Glucose, Bld: 132 mg/dL — ABNORMAL HIGH (ref 70–99)
Potassium: 4.2 mEq/L (ref 3.5–5.3)
Sodium: 142 mEq/L (ref 135–145)
Total Bilirubin: 0.4 mg/dL (ref 0.3–1.2)
Total Protein: 6.9 g/dL (ref 6.0–8.3)

## 2009-11-19 LAB — LACTATE DEHYDROGENASE: LDH: 129 U/L (ref 94–250)

## 2010-05-17 ENCOUNTER — Ambulatory Visit: Payer: Self-pay | Admitting: Oncology

## 2010-05-19 LAB — CBC WITH DIFFERENTIAL/PLATELET
BASO%: 0.6 % (ref 0.0–2.0)
Basophils Absolute: 0 10e3/uL (ref 0.0–0.1)
EOS%: 2.9 % (ref 0.0–7.0)
Eosinophils Absolute: 0.2 10e3/uL (ref 0.0–0.5)
HCT: 38 % (ref 34.8–46.6)
HGB: 13 g/dL (ref 11.6–15.9)
LYMPH%: 25.2 % (ref 14.0–49.7)
MCH: 31.4 pg (ref 25.1–34.0)
MCHC: 34.3 g/dL (ref 31.5–36.0)
MCV: 91.7 fL (ref 79.5–101.0)
MONO#: 0.8 10e3/uL (ref 0.1–0.9)
MONO%: 10.2 % (ref 0.0–14.0)
NEUT#: 4.9 10e3/uL (ref 1.5–6.5)
NEUT%: 61.1 % (ref 38.4–76.8)
Platelets: 266 10e3/uL (ref 145–400)
RBC: 4.14 10e6/uL (ref 3.70–5.45)
RDW: 13.8 % (ref 11.2–14.5)
WBC: 7.9 10e3/uL (ref 3.9–10.3)
lymph#: 2 10e3/uL (ref 0.9–3.3)

## 2010-05-19 LAB — COMPREHENSIVE METABOLIC PANEL WITH GFR
ALT: 11 U/L (ref 0–35)
AST: 17 U/L (ref 0–37)
Albumin: 4.2 g/dL (ref 3.5–5.2)
Alkaline Phosphatase: 62 U/L (ref 39–117)
BUN: 20 mg/dL (ref 6–23)
CO2: 25 meq/L (ref 19–32)
Calcium: 9.3 mg/dL (ref 8.4–10.5)
Chloride: 105 meq/L (ref 96–112)
Creatinine, Ser: 1.13 mg/dL (ref 0.40–1.20)
Glucose, Bld: 146 mg/dL — ABNORMAL HIGH (ref 70–99)
Potassium: 4.4 meq/L (ref 3.5–5.3)
Sodium: 140 meq/L (ref 135–145)
Total Bilirubin: 0.4 mg/dL (ref 0.3–1.2)
Total Protein: 7.2 g/dL (ref 6.0–8.3)

## 2010-05-19 LAB — LACTATE DEHYDROGENASE: LDH: 133 U/L (ref 94–250)

## 2010-11-18 ENCOUNTER — Other Ambulatory Visit (HOSPITAL_COMMUNITY): Payer: Self-pay | Admitting: Oncology

## 2010-11-18 ENCOUNTER — Encounter (HOSPITAL_BASED_OUTPATIENT_CLINIC_OR_DEPARTMENT_OTHER): Payer: Medicare Other | Admitting: Oncology

## 2010-11-18 ENCOUNTER — Ambulatory Visit (HOSPITAL_COMMUNITY)
Admission: RE | Admit: 2010-11-18 | Discharge: 2010-11-18 | Disposition: A | Discharge status: Home / Self Care | Payer: Medicare Other | Source: Ambulatory Visit | Attending: Oncology | Admitting: Oncology

## 2010-11-18 DIAGNOSIS — C50919 Malignant neoplasm of unspecified site of unspecified female breast: Secondary | ICD-10-CM

## 2010-11-18 DIAGNOSIS — Z170 Estrogen receptor positive status [ER+]: Secondary | ICD-10-CM

## 2010-11-18 DIAGNOSIS — M4180 Other forms of scoliosis, site unspecified: Secondary | ICD-10-CM | POA: Insufficient documentation

## 2010-11-18 DIAGNOSIS — Z9010 Acquired absence of unspecified breast and nipple: Secondary | ICD-10-CM | POA: Insufficient documentation

## 2010-11-18 LAB — COMPREHENSIVE METABOLIC PANEL
ALT: 13 U/L (ref 0–35)
AST: 19 U/L (ref 0–37)
Albumin: 4.2 g/dL (ref 3.5–5.2)
Alkaline Phosphatase: 60 U/L (ref 39–117)
BUN: 20 mg/dL (ref 6–23)
CO2: 25 mEq/L (ref 19–32)
Calcium: 9.8 mg/dL (ref 8.4–10.5)
Chloride: 105 mEq/L (ref 96–112)
Creatinine, Ser: 1.07 mg/dL (ref 0.40–1.20)
Glucose, Bld: 139 mg/dL — ABNORMAL HIGH (ref 70–99)
Potassium: 4.3 mEq/L (ref 3.5–5.3)
Sodium: 140 mEq/L (ref 135–145)
Total Bilirubin: 0.3 mg/dL (ref 0.3–1.2)
Total Protein: 7.2 g/dL (ref 6.0–8.3)

## 2010-11-18 LAB — CBC WITH DIFFERENTIAL/PLATELET
BASO%: 0.7 % (ref 0.0–2.0)
Basophils Absolute: 0.1 10*3/uL (ref 0.0–0.1)
EOS%: 3.1 % (ref 0.0–7.0)
Eosinophils Absolute: 0.2 10*3/uL (ref 0.0–0.5)
HCT: 38 % (ref 34.8–46.6)
HGB: 13 g/dL (ref 11.6–15.9)
LYMPH%: 27.7 % (ref 14.0–49.7)
MCH: 30.3 pg (ref 25.1–34.0)
MCHC: 34.2 g/dL (ref 31.5–36.0)
MCV: 88.6 fL (ref 79.5–101.0)
MONO#: 0.5 10*3/uL (ref 0.1–0.9)
MONO%: 6.5 % (ref 0.0–14.0)
NEUT#: 4.8 10*3/uL (ref 1.5–6.5)
NEUT%: 62 % (ref 38.4–76.8)
Platelets: 242 10*3/uL (ref 145–400)
RBC: 4.29 10*6/uL (ref 3.70–5.45)
RDW: 13.5 % (ref 11.2–14.5)
WBC: 7.7 10*3/uL (ref 3.9–10.3)
lymph#: 2.1 10*3/uL (ref 0.9–3.3)

## 2010-11-18 LAB — LACTATE DEHYDROGENASE: LDH: 142 U/L (ref 94–250)

## 2010-12-20 NOTE — Assessment & Plan Note (Signed)
OFFICE VISIT   NOTNAMED, CROUCHER  DOB:  03/18/1929                                       09/10/2007  CHART#:[id number]   The patient returns 3 months post laser ablation of her left greater  saphenous vein with multiple stab phlebectomies for painful varicosities  and distal edema.  She has had complete resolution of her symptoms in  the left leg, she states, having no throbbing, aching, or burning, and  not having to arise during the night because of pain.  She also has  noted no swelling in the left ankle and foot, and is not wearing elastic  compression stockings.  She has no history of deep vein thrombosis or  thrombophlebitis in the left leg.  She does have symptomatic  varicosities in the right calf, and the greater saphenous system,  particularly in the medial calf, which caused aching and throbbing  discomfort.  This has become more severe, but is not as bad as the left  leg was prior to her procedure.  She would like to follow this and see  how it does with conservative treatment.   EXAMINATION:  She has excellent femoral, popliteal, and dorsalis pedis  pulses bilaterally with well-perfused lower extremities.  There is no  distal edema in the left leg, and all the stab phlebectomy sites are  barely visible.  The right leg does have the prominent varicosity in the  greater saphenous system as noted with mild distal edema.  She will  return to see [place] on a p.r.n. basis if her symptoms become worse in the  right leg.   [name], [name]  Electronically Signed   JDL/MEDQ  D:  09/10/2007  T:  09/11/2007  Job:  [id number]

## 2010-12-20 NOTE — Assessment & Plan Note (Signed)
OFFICE VISIT   Morgan [name], [name]  DOB:  1929-02-17                                       06/04/2007  CHART#:[id number]   The patient has severe symptomatic varicose veins of the greater  saphenous system of the left leg with a history of bleeding on a few  episodes in the past.  She was evaluated in July of this year and has  attempted conservative measures, including elastic compression stockings  on a daily basis, analgesics, and elevation of the leg, but continues to  be symptomatic.  She has had no further bleeding episodes.  Her  discomfort is an aching, throbbing, burning discomfort in the thigh and  calf regions.   EXAMINATION:  Blood pressure 160/90, heart rate is 100, respirations 16.  She continues to have large bulbus varicosities in the distal thigh and  calf with large spider and reticular veins around the ankle area.  This  is where the bleeding occurred.  I think she would benefit from laser  ablation of the left greater saphenous vein with multiple stab  phlebectomies, and we will proceed with this in the near future since  she continues to be symptomatic despite conservative therapy.   [name], [name]  Electronically Signed   JDL/MEDQ  D:  06/04/2007  T:  06/05/2007  Job:  [id number]

## 2010-12-20 NOTE — Assessment & Plan Note (Signed)
OFFICE VISIT   Morgan, [name]  DOB:  [birth year]                                       03/07/2007  CHART#:[id number]   The patient is a 77 year old female who I have seen in the past for  possible TIA's in December of 2005.  She has had no recurrent symptoms  and was not found to have any significant carotid  disease at that time.  She has; however, developed progressively worse varicose veins in the  left leg and had a significant bleeding episode in December of 2007 with  severe enough bleeding that it required calling EMS to take her to the  emergency room and eventually, the bleeding subsided.  She has not had  further bleeding since that time.  She does have severe varicose veins  in the left leg she states and has had progressive swelling in her legs,  in both ankles with the left worse than the right, and has also had a  lot of aching, throbbing and burning discomfort in the left thigh and  calf.  This seems to have become progressively worse over the last  several months.  She is having no significant pain symptoms in the  contralateral right leg.  She has no history of deep venous thrombosis,  thrombophlebitis, or ulceration.  She is not wearing elastic compression  stockings or tried elevation of the legs to any great degree.  She does  not take pain medication on a regular basis.   PHYSICAL EXAMINATION:  She has excellent arterial pulses in the lower  extremities, 3+ in the femoral, popliteal and dorsalis pedis levels.  Left leg has severe greater saphenous varicosities beginning in the  medial and distal thigh extending into the medial calf.  She also has a  large cluster of spider veins in the ankle area, particularly medially.  The right leg has greater saphenous varicosities in the midcalf  extending down in the pretibial region.  There is also spider veins on  the right side in the ankle and calf area.  Left leg has 1+ edema,  slightly more  than the right side.  There is no hyperpigmentation or  stasis ulcers bilaterally.   A venous ultrasound was performed in the office today and this revealed  gross reflux at the saphenofemoral junction on the left with  incompetence of the left greater saphenous vein throughout.  The short  saphenous vein on the left is competent and there is no evidence of deep  venous obstruction.   I think she does have severely symptomatic varicose veins in the left  greater saphenous system and we will treat these with elastic  compression stockings, elevation, and pain medication to see if she can  gain symptomatic relief because these are effecting her daily living.  She will return in 3 months and see if she has had any improvement in  her symptoms.  If not, I think she would be a good candidate for laser  ablation of her left greater saphenous vein with excision of multiple  varicosities as an office based procedure.   [name], [name]  Electronically Signed   JDL/MEDQ  D:  03/07/2007  T:  03/08/2007  Job:  [id number]   cc:   [name], [name]

## 2010-12-20 NOTE — Assessment & Plan Note (Signed)
OFFICE VISIT   YASMYN, BELLISARIO  DOB:  1928/12/05                                       06/24/2007  CHART#:[id number]   The patient underwent laser ablation of her left greater saphenous vein  with multiple stab phlebectomies 1 week ago and has had an excellent  early result.  She has had minimal discomfort below the knee and some  mild to moderate discomfort along the course of the greater saphenous  vein in the thigh, which has almost resolved.  She has had some  bruising, but no distal edema.  She has been wearing her elastic  compression stocking during the day and taking her ibuprofen as  directed.   EXAM:  She has an excellent dorsalis pedis pulse in the left foot with  no distal edema.  I performed a limited venous duplex exam in the office  today, and there is no evidence of any deep vein thrombosis with normal  venous flow noted in the deep system.  The saphenous vein is occluded  from near the saphenofemoral junction to the knee.   She was reassured regarding these findings.  Return in 3 months for  final followup.   [name], [name]  Electronically Signed   JDL/MEDQ  D:  06/24/2007  T:  06/25/2007  Job:  [id number]

## 2010-12-20 NOTE — Procedures (Signed)
LOWER EXTREMITY VENOUS REFLUX EXAM   INDICATION:  Left leg varicose vein and pain.   EXAM:  Using color-flow imaging and pulse Doppler spectral analysis, the  left common femoral, superficial femoral, popliteal, posterior tibial,  greater and lesser saphenous veins are evaluated.  There is no evidence  suggesting deep venous insufficiency in the left lower extremity.   The left saphenofemoral junction is not competent.  The left GSV is not  competent with the caliber as described below.   The left proximal short saphenous vein demonstrates competency   GSV Diameter (used if found to be incompetent only)                                            Right    Left  Proximal Greater Saphenous Vein           cm       0.55 cm  Proximal-to-mid-thigh                     cm       0.55 cm  Mid thigh                                 cm       0.56 cm  Mid-distal thigh                          cm       0.56 cm  Distal thigh                              cm       0.54 cm  Knee                                      cm       0.41 cm   IMPRESSION:  1. Left greater saphenous vein reflux is identified with the caliber      ranging from 0.41 cm to 0.8 cm knee to groin.  2. The left greater saphenous vein is not aneurysmal.  3. The left greater saphenous vein is not tortuous.  4. There is no evidence of significant deep venous insufficiency in      the left lower extremity.  5. The deep venous system is competent.  6. The left lesser saphenous vein is competent.  7. No evidence of deep venous thrombosis noted in the left leg.   ___________________________________________  Quita Skye. Hart Rochester, [name]   MG/MEDQ  D:  03/07/2007  T:  03/08/2007  Job:  [id number]

## 2010-12-23 NOTE — Discharge Summary (Signed)
NAME:  ILIANA, [name] NO.:  [id number]   MEDICAL RECORD NO.:  [id number]          PATIENT TYPE:  INP   LOCATION:  3034                         FACILITY:  [facility]   PHYSICIAN:  Mallory Shirk, MD     DATE OF BIRTH:  11/29/1928   DATE OF ADMISSION:  05/23/2004  DATE OF DISCHARGE:  05/26/2004                                 DISCHARGE SUMMARY   DISCHARGE DIAGNOSES:  1.  Transient ischemic attack.  2.  Hypertension.  3.  Hypothyroidism.  4.  Leukocytosis.  5.  Diabetes mellitus.  6.  Tachycardia.   DISCHARGE MEDICATIONS:  1.  Aspirin 81 mg p.o. daily.  2.  Glipizide 5 mg p.o. daily.  3.  Synthroid 75 mcg p.o. daily.  4.  Lisinopril 5 mg p.o. daily.  5.  Metformin 500 mg p.o. b.i.d.  6.  Metoprolol 12.5 mg p.o. b.i.d.  7.  K-Dur 20 mEq one tablet p.o. for five days, then stop.   HISTORY OF PRESENT ILLNESS:  Morgan [name] is a very pleasant 77 year old  Caucasian woman with a history of breast cancer status post mastectomy of  her left breast.  Came in with chief complaint of transient neurologic  symptoms.  Patient had undergone a __________ mastectomy in June of 2005 and  is status post chemotherapy, the fifth of six treatments being the Wednesday  before admission, with anticipated radiation therapy to follow.  She has  also been receiving Taxotere and Cytoxan.  On the morning of admission she  was unable to provide a time frame.  Developed incontinence which was new  for her.  Has never had problems with this before.  Also had bilateral loss  of arm coordination.  She also had a generalized feeling of unwellness.  She  was in her recliner chair at home and called out to her dog.  However, she  describes the symptoms of aphasia.  Legs were weak.  She subsequently  slipped to the floor, scrambled to call her doctor on the phone who  dispatched EMS.  Upon EMS arrival her symptoms had resolved.  Presented to  the emergency room completely asymptomatic.   PAST  MEDICAL HISTORY:  1.  Breast cancer in the left breast 1989 status post mastectomy with a      newly diagnosed cancer in 2005 on chemotherapy.  2.  Diabetes mellitus type 2.  3.  Hypertension.  4.  Hypothyroidism.  5.  Status post cholecystectomy.   MEDICATIONS ON ADMISSION:  1.  Aspirin.  2.  Metformin 50 mg p.o. b.i.d.  3.  Lisinopril 5 mg p.o. daily.  4.  Synthroid 0.075 mg p.o. daily.  5.  Dexamethasone 4 mg prior to chemotherapy and 4 mg post chemotherapy      treatments.  6.  Hydrocodone p.r.n.  7.  Tylenol p.r.n.  8.  Glucotrol XL 10 mg p.o. daily.  9.  Prochlorperazine 10 mg p.o. daily.   ALLERGIES:  Patient has no known drug allergies.   PHYSICAL EXAMINATION:  VITAL SIGNS:  Blood pressure 136/65, temperature  97.4, O2 saturations 100% on room  air.  GENERAL:  Patient was alert and oriented, in no acute distress, answering  questions appropriately.  No evidence of confusion or disorientation.  HEENT:  Normocephalic, atraumatic.  Extraocular muscles intact.  PERRL.  Oropharynx non-erythematous.  NECK:  Supple.  No lymphadenopathy.  No thyromegaly.  CVS:  S1 plus S2.  No murmurs, rubs, or gallops.  No carotid bruits.  LUNGS:  Clear to auscultation bilaterally.  No dullness to percussion.  No  rales.  ABDOMEN:  Positive bowel sounds, soft, nontender.  No masses.  EXTREMITIES:  No clubbing, cyanosis, edema.  NEUROLOGIC:  Nonfocal.   LABORATORY DATA:  On admission WBC 17.8, hemoglobin 9.9, platelet count  343,000, MCV 89.0.  Sodium 139, potassium 4.6, BUN 20, creatinine 1.0,  glucose 61.  EKG showed sinus brady and then sinus tachy.   CT scan of the head:  No acute intracranial process.   HOSPITAL COURSE:  Patient was admitted to a monitor bed.   #1 - TRANSIENT ISCHEMIC ATTACK:  CT head:  No acute intracranial process.  MRA head:  No high grade stenosis of the major intracranial vessels.  Left  vertebral artery is dominant.  Mild ectasia of the vertebrobasilar  system.  No aneurysms detected.  MRI of the brain:  No acute infarct or abnormal  intracranial enhancing lesion.   Patient had no symptoms during her stay.  She will be discharged on aspirin.   #2 - HYPERTENSION:  Patient's blood pressure was well controlled during her  hospital stay.  She is on metoprolol 12.5 mg p.o. daily.  This would also  help with the tachycardia which was persistent throughout the hospital stay.  On day of discharge her pulse was 109.   #3 - HYPOTHYROIDISM:  Patient is on Synthroid at 75 mcg daily.  Her TSH and  T3, T4 were within normal limits.  We have continued her Synthroid in spite  of her tachycardia.  PCP, Dr. [name], will reevaluate  medications next week.   #4 - DIABETES MELLITUS:  Well controlled on Glipizide.   #5 - LEUKOCYTOSIS:  Upon admission patient's WBCs were 17.8.  This probably  was secondary to the dexamethasone she had received prior to chemotherapy.  She had no fever or any signs of infection during the hospital stay.  On the  day of discharge WBCs were 9.3.   #6 - HYPOKALEMIA:  Patient's potassium was 3.4 on the day of discharge.  She  was given a 40 mEq p.o. supplement and will be discharged with 20 mEq daily  x5 days.  Dr. Smith Mince will check a BMP on October 25 to check potassium  again.   FOLLOWUP APPOINTMENTS:  With Dr. [name] on May 31, 2004 at  9:15 a.m. at which time a BMP will be drawn to check potassium levels.  All  medications will be reviewed with Dr. Smith Mince at that time.   Patient was advised to limit her activity on an as tolerated basis.  She was  also advised to return to the emergency room immediately upon onset of  dizziness, shortness of breath, or any other symptom that may need immediate  medical attention.       GDK/MEDQ  D:  05/26/2004  T:  05/26/2004  Job:  [id number]

## 2010-12-23 NOTE — Op Note (Signed)
NAME:  Morgan [name], Morgan [name] NO.:  [id number]   MEDICAL RECORD NO.:  [id number]                   PATIENT TYPE:  OBV   LOCATION:  0443                                 [facility]ACILITY:  [facility]   PHYSICIAN:  Angelia Mould. Derrell Lolling, [name]             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   OPERATION/PROCEDURE:  Right modified radical mastectomy.   SURGEON:  Angelia Mould. Derrell Lolling, [name]   FIRST ASSISTANT:  Ollen Gross. Carolynne Edouard, [name]   OPERATIVE INDICATIONS:  This is a 77 year old woman who underwent left  modified radical mastectomy in 1989.  She had multiple positive nodes and  received postoperative adjuvant radiation therapy.  She has had no known  recurrence to date.  She recently was found to have palpable right axillary  lymphadenopathy.  Mammogram, ultrasound, and MRI were all performed.  She  has at least three abnormal right axillary lymph nodes seen but no  abnormality was noted in the right breast.  We cannot palpate any mass in  the breast, but there are multiple palpable mobile lymph nodes in the right  axilla.  She had a core biopsy of one of these lymph nodes which shows  breast cancer.  Her case has been discussed in breast conference and with  Dr. Arline Asp.  We have elected to go ahead with a modified radical  mastectomy.   OPERATIVE TECHNIQUE:  Following the induction of general endotracheal  anesthesia, the patient's right chest and upper arm and axilla were prepped  and draped in a sterile fashion.  A transverse elliptical incision was made  in the right  breast, encompassing the nipple and areola.  Skin flaps were  raised superiorly to just below the clavicle, medially to the sternal  border, inferiorly to the anterior rectus sheath, and laterally to the  latissimus dorsi muscle.  The breast was dissected away from the underlying  pectoralis major muscle with electrocautery.  Dissection was carried  laterally, dissecting the breast away from the lateral border of the  pectoralis major muscle and ultimately away from the lateral border of the  pectoralis minor muscle.  We incised the clavipectoral fascia and entered  the axilla.  It was apparent that there were multiple enlarged lymph nodes,  some as small as 1 cm but one was almost 3 cm in size.  These were not  matted.  They were individual and mobile.  There was no fixation to nerve or  blood vessel.  We dissected out all level 1 and level 2 lymph nodes  and  removed all palpable lymph nodes.  We identified the long thoracic nerve,  and it was preserved and functioning at the end of the case.  We identified  the thoracodorsal neurovascular bundle and preserved that, and the  thoracodorsal nerve was functioning at the end of the case.  Some small  venous tributaries of the axillary vein were controlled with multiple metal  clips and divided.  Intercostal brachial cutaneous nerve was controlled  between metal clips and divided.  This specimen was ultimately removed  intact.  The breast and axilla were copiously irrigated with 2 liters of  saline.  Hemostasis was excellent.  We checked the functioning of the long  thoracic nerve and thoracodorsal nerve one more time, and everything seemed  fine with good function of the muscle in response to compression.  Two 19  French round Blake drains were placed, one up into the axilla and one across  the skin flaps. These were brought out through separate stab incisions in  the right lateral chest wall.  Sutured to the skin with nylon sutures and   connected to suction bulb.  The skin was closed with  skin staples and an occlusive dressing was placed.  The patient tolerated  the procedure well and was taken to the recovery room in stable condition.  Estimated blood loss was about 150 cc.  Complications were none.  Sponge,  needle, and instrument counts were correct.                                               Angelia Mould. Derrell Lolling, [name]    HMI/MEDQ  D:  02/04/2004  T:  02/04/2004  Job:  [id number]   cc:   [name], [name]  [address]  [place]  [place] [address]  Fax: [phone]   [name], [name]  [address] [facility]  [place]  [place] [address]  Fax: [phone]

## 2010-12-23 NOTE — H&P (Signed)
NAME:  Morgan [name], FALLAS NO.:  [id number]   MEDICAL RECORD NO.:  [id number]          PATIENT TYPE:  INP   LOCATION:  3034                         FACILITY:  [facility]   PHYSICIAN:  Hettie Holstein, D.O.    DATE OF BIRTH:  [birth year]   DATE OF ADMISSION:  05/23/2004  DATE OF DISCHARGE:                                HISTORY & PHYSICAL   PRIMARY CARE PHYSICIAN:  [name], [name]   ONCOLOGIST:  [name], [name]   CHIEF COMPLAINT:  Transient neurologic symptoms.   HISTORY OF PRESENT ILLNESS:  The patient is a pleasant 77 year old  independently living female who has had an extensive past medical history  with regards to breast cancer in 1989.  She had a mastectomy of her left  breast due to an inflammatory cancer which she has done quite well with up  until June of this year at which time she and her primary care physician  discovered a right breast cancer.  She has undergone a right radical  mastectomy in June of 2005 and has undergone chemotherapy, the fifth of  sixth treatments being this past Wednesday with anticipated radiation  therapy to follow.  She had been receiving Taxotere and Cytoxan. In any  event, this morning she cannot provide a time frame; however, she stated  that she developed incontinence which was new for her.  She has never had  problems with this before and she also had bilateral loss of arm  coordination.  She had a generalized unwell feeling. She was in her recliner  chair at home and called out to her dog.  However, she describes symptoms of  aphasia.  Her legs were quite weak.  She subsequently slid to the floor and  scrambled to call her daughter on the phone who dispatched 911.  Upon EMS  arrival, her symptoms had resolved.  She presented to the emergency  department asymptomatic.  A non-contrast CT scan of her brain was  unremarkable and she is being admitted for further evaluation and management  including further imaging  studies.   I have discussed this finding with her oncologist, Dr. Arline Asp, and it is  not felt that a course of chemotherapy are related to these findings.   PAST MEDICAL HISTORY:  1.  Breast cancer of her left breast in 1989 status post mastectomy and a      new diagnosed cancer in June of 2005.  Her last chemotherapy was this      past Wednesday with one more anticipated.  2.  History of non-insulin dependent diabetes.  3.  Hypertension.  4.  Thyroid disease.  5.  Status post cholecystectomy.   MEDICATIONS:  1.  Aspirin daily.  2.  Metformin 500 mg p.o. b.i.d.  3.  Lisinopril 5 mg daily.  4.  Synthroid 0.075 mg daily.  5.  Dexamethasone 4 mg the day preceding chemotherapy and 4 mg a day post      chemotherapy treatments.  6.  Hydrocodone 500 mg.  7.  Tylenol p.r.n.  8.  Glucotrol XL 10 mg daily.  9.  Prochlorperazine 10 mg a day.  10. The patient has been receiving Aranesp for the last two weeks in      addition to Neulasta following each of her chemotherapy treatments.   ALLERGIES:  No known drug allergies.   SOCIAL HISTORY:  The patient currently lives alone. She is a lifetime non-  smoker.  She only drinks occasional wine.  She lives about 15 minutes away  from her only daughter.  She is divorced from her husband who is now  deceased.   FAMILY HISTORY:  Significant for a mother who had breast cancer and  underwent a mastectomy at age 56.  Father had heart problems; the details  are not further available or known to the patient.   REVIEW OF SYMPTOMS:  GENERAL:  The patient has been in her usual state of  health.  She has had some weakness after her chemotherapy treatments.  GI:  She has had no nausea or vomiting.  She has had some diarrhea.  No abdominal  pain.  CARDIOPULMONARY:  No chest pain, shortness of breath or swelling in  her lower extremities.  In any event, she did not have any symptoms such as  these before in the past.  She has no hematochezia, melena,  coffee ground  emesis.  GU:  No dysuria with the exception of this episode of incontinence  which she says is not typical of her.  Otherwise, further review of systems  is unremarkable.   PHYSICAL EXAMINATION:  VITAL SIGNS:  Blood pressure is 136/65, temperature  97.4, 02 saturation on room air is 100%.  GENERAL:  The patient is alert, pleasant and in no acute distress, answering  questions appropriately, exhibiting no evidence of confusion or  disorientation.  HEENT:  The head is normocephalic, atraumatic.  Extraocular muscles are  intact.  Pupils equal, round and reactive to light and accommodation.  Examination of her mouth reveals no evidence of thrush. Her neck is supple  and non-tender.  There is no palpable mass or thyromegaly.  CARDIOVASCULAR:  Normal S1 and S2 without appreciable murmur or gallop. No  evidence of carotid bruits.  LUNGS:  Clear lung sounds bilaterally. There is no dullness to percussion  and she exhibited normal effort of respirations.  ABDOMEN:  Soft and non-tender.  No palpable mass.  EXTREMITIES:  No cyanosis, clubbing or edema.  NEUROLOGIC EXAM:  +5 in all four extremities and no focal neurologic  deficits.  Deep tendon reflexes were 2/4 times four and she was euthymic and  her affect was stable.   LABORATORY DATA:  WBC 17.8, hemoglobin 9.9, platelet count 343,000 and MCV  89.0.  Sodium 139, potassium 4.6, BUN 20, creatinine 1.0, glucose 61.   EKG revealed initially sinus bradycardia, then sinus tachycardia.  CT scan  of her head was not revealing of an acute event.   IMPRESSION:  1.  Transient neurologic episode.  2.  Breast cancer times two.  3.  Currently under chemotherapy with Cytoxan and Taxotere with anticipation      for radiation therapy to follow.  4.  Diabetes mellitus with a blood glucose of 61 on ED arrival and when      checked in the field found to be borderline but greater than 60.  5.  Hypertension.  PLAN:  At this time, we are  going to admit the patient for further  evaluation, management, check MRI and MRA studies, rule out acute infarct,  follow on the neuro floor and follow  her blood sugars closely. There is a  possibility that this could be explained by hypoglycemia if no other  etiology is apparent.  We will follow her CBG's on the floor and half her  dose of Glucotrol for now. We will check a hemoglobin A1c to assess degree  of control and continue her medications as she was at home.       ESS/MEDQ  D:  05/23/2004  T:  05/23/2004  Job:  [id number]   cc:   [name], [name]  [address]  [place]  [place] [address]  Fax: [phone]   [name], [name]  [address] [facility]  [place]  [place] [address]  Fax: [phone]

## 2010-12-23 NOTE — Procedures (Signed)
[facility]  Patient:    Morgan [name], Morgan [name] Visit Number: [id number] MRN: [id number]          Service Type: END Location: ENDO Attending Physician:  Rich Brave Proc. Date: 10/04/01 Admit Date:  10/04/2001   CC:         [name], [name]  [name], [name]   Procedure Report  PROCEDURE PERFORMED:  Colonoscopy.  ENDOSCOPIST:  Florencia Reasons, [name]  INDICATION:  Mrs. Inch is a 77 year old female for colon cancer screening. She has a personal remote history of breast cancer.  She does not have any worrisome GI tract symptoms at this time.  FINDINGS:  Extensive pancolic diverticulosis, otherwise normal exam.  DESCRIPTION OF PROCEDURE:  The nature, purpose, and risks of the procedure had been discussed with the patient who had provided written consent.  Sedation with fentanyl 100 mcg and Versed 10 mg IV without arrhythmias or desaturation. The Olympus adjustable tension pediatric video colonoscope was advanced with some looping that had to be overcome by having the patient in the supine position and applying external abdominal compression.  Ultimately, I believe we reached the cecum as evidenced by the absence of further lumen, and what appeared to be the appendiceal orifice, although there was not transillumination of the right lower quadrant (not surprising, since she is obese) and I really could not define a definite ileocecal valve.  Overall, however, I am quite confident this was the cecum based on its appearance, and the depth of scope insertion.  Pullback was then performed.  The quality of the prep was very good, and it is felt that all areas were well seen.  There was moderately extensive pancolic diverticulosis.  However, no polyps, cancer, colitis, or vascular malformations were seen.  Retroflexion was not performed due to a small rectal ampulla but antegrade viewing disclosed no distal rectal lesions and  reinspection of the rectosigmoid disclosed no additional findings.  No biopsies were obtained. The patient tolerated the procedure well and there were no apparent complications.  IMPRESSION:  Normal exam except for extensive pancolic diverticulosis.  PLAN:  Consider screening flexible sigmoidoscopy in five years. Attending Physician:  Rich Brave DD:  10/04/01 TD:  10/05/01 Job: [id number]

## 2010-12-28 ENCOUNTER — Encounter (INDEPENDENT_AMBULATORY_CARE_PROVIDER_SITE_OTHER): Payer: Self-pay | Admitting: General Surgery

## 2011-05-11 ENCOUNTER — Encounter (HOSPITAL_BASED_OUTPATIENT_CLINIC_OR_DEPARTMENT_OTHER): Payer: Medicare Other | Admitting: Oncology

## 2011-05-11 ENCOUNTER — Other Ambulatory Visit (HOSPITAL_COMMUNITY): Payer: Self-pay | Admitting: Oncology

## 2011-05-11 DIAGNOSIS — Z170 Estrogen receptor positive status [ER+]: Secondary | ICD-10-CM

## 2011-05-11 DIAGNOSIS — C50919 Malignant neoplasm of unspecified site of unspecified female breast: Secondary | ICD-10-CM

## 2011-05-11 LAB — CBC WITH DIFFERENTIAL/PLATELET
BASO%: 0.6 % (ref 0.0–2.0)
Basophils Absolute: 0 10*3/uL (ref 0.0–0.1)
EOS%: 3.1 % (ref 0.0–7.0)
Eosinophils Absolute: 0.2 10*3/uL (ref 0.0–0.5)
HCT: 37.8 % (ref 34.8–46.6)
HGB: 13.1 g/dL (ref 11.6–15.9)
LYMPH%: 28.8 % (ref 14.0–49.7)
MCH: 31.6 pg (ref 25.1–34.0)
MCHC: 34.7 g/dL (ref 31.5–36.0)
MCV: 91.2 fL (ref 79.5–101.0)
MONO#: 0.7 10*3/uL (ref 0.1–0.9)
MONO%: 9.3 % (ref 0.0–14.0)
NEUT#: 4.5 10*3/uL (ref 1.5–6.5)
NEUT%: 58.2 % (ref 38.4–76.8)
Platelets: 276 10*3/uL (ref 145–400)
RBC: 4.14 10*6/uL (ref 3.70–5.45)
RDW: 13.6 % (ref 11.2–14.5)
WBC: 7.7 10*3/uL (ref 3.9–10.3)
lymph#: 2.2 10*3/uL (ref 0.9–3.3)

## 2011-05-11 LAB — COMPREHENSIVE METABOLIC PANEL
ALT: 14 U/L (ref 0–35)
AST: 19 U/L (ref 0–37)
Albumin: 4.2 g/dL (ref 3.5–5.2)
Alkaline Phosphatase: 58 U/L (ref 39–117)
BUN: 19 mg/dL (ref 6–23)
CO2: 24 mEq/L (ref 19–32)
Calcium: 9.4 mg/dL (ref 8.4–10.5)
Chloride: 106 mEq/L (ref 96–112)
Creatinine, Ser: 1.13 mg/dL — ABNORMAL HIGH (ref 0.50–1.10)
Glucose, Bld: 101 mg/dL — ABNORMAL HIGH (ref 70–99)
Potassium: 4.2 mEq/L (ref 3.5–5.3)
Sodium: 140 mEq/L (ref 135–145)
Total Bilirubin: 0.5 mg/dL (ref 0.3–1.2)
Total Protein: 7.3 g/dL (ref 6.0–8.3)

## 2011-05-11 LAB — LACTATE DEHYDROGENASE: LDH: 130 U/L (ref 94–250)

## 2011-08-21 ENCOUNTER — Telehealth: Payer: Self-pay | Admitting: Oncology

## 2011-08-21 NOTE — Telephone Encounter (Signed)
pt called for 11/2010 appt,made for 11/16/11 and printed for pt  aom

## 2011-08-28 ENCOUNTER — Ambulatory Visit (INDEPENDENT_AMBULATORY_CARE_PROVIDER_SITE_OTHER): Payer: Medicare Other | Admitting: General Surgery

## 2011-08-28 ENCOUNTER — Encounter (INDEPENDENT_AMBULATORY_CARE_PROVIDER_SITE_OTHER): Payer: Self-pay | Admitting: General Surgery

## 2011-08-28 VITALS — BP 166/70 | HR 70 | Temp 98.1°F | Resp 18 | Ht 64.0 in | Wt 179.2 lb

## 2011-08-28 DIAGNOSIS — Z853 Personal history of malignant neoplasm of breast: Secondary | ICD-10-CM

## 2011-08-28 NOTE — Patient Instructions (Signed)
Your physical exam today is stable, and there is no evidence of recurrent cancer on either side. Keep your regular appointment with Dr. Arline Asp every 6 months. I will see you again in one year.

## 2011-08-28 NOTE — Progress Notes (Signed)
Patient ID: Philis Pique, female   DOB: September 28, 1928, 78 y.o.   MRN: [id number]  Chief Complaint  Patient presents with  . Breast Cancer Long Term Follow Up    HPI JAILYNE CHIEFFO is a 78 y.o. female.  She returns to see me for long-term followup regarding her bilateral breast cancers.  In approximately 1992 she underwent left modified radical mastectomy for stage IIIB cancer. She had postop radiation therapy.  In 2005 she underwent right modified radical mastectomy for stage IIIC cancer which was receptor positive. She had postop radiation therapy. She took arimidex which was discontinued in 2011.  Her health has been stable. She has no new medical problems. Her energy level is stable. He has chronic lymphedema of the right arm which is mild but without any pain or skin breakdown.  She last saw Dr. Arline Asp in the [facility] of May 11, 2011. Dr. Thayer Headings is her primary care physician. HPI  Past Medical History  Diagnosis Date  . Hearing loss   . Cancer     breast    Past Surgical History  Procedure Date  . Mastectomy 1989/2005  . Cholecystectomy 1978?    Family History  Problem Relation Age of Onset  . Cancer Mother     breast  . Heart disease Father   . Diabetes Father   . Heart disease Brother     Social History History  Substance Use Topics  . Smoking status: Never Smoker   . Smokeless tobacco: Never Used  . Alcohol Use: Yes     occasional glass of wine    No Known Allergies  Current Outpatient Prescriptions  Medication Sig Dispense Refill  . simvastatin (ZOCOR) 10 MG tablet Take 10 mg by mouth at bedtime.      Marland Kitchen anastrozole (ARIMIDEX) 1 MG tablet Take 1 mg by mouth daily.        . calcium carbonate (OS-CAL) 600 MG TABS Take 600 mg by mouth 2 (two) times daily with a meal.        . clopidogrel (PLAVIX) 75 MG tablet Take 75 mg by mouth daily.        Marland Kitchen levothyroxine (SYNTHROID, LEVOTHROID) 88 MCG tablet Take 88 mcg by mouth daily.         Marland Kitchen lisinopril (PRINIVIL,ZESTRIL) 10 MG tablet Take 5 mg by mouth daily.        . metFORMIN (GLUMETZA) 500 MG (MOD) 24 hr tablet Take 500 mg by mouth 2 (two) times daily with a meal.       . metoprolol succinate (TOPROL-XL) 25 MG 24 hr tablet Take 25 mg by mouth daily.        . multivitamin (THERAGRAN) liquid Take 1 mL by mouth daily.        . vitamin E (VITAMIN E) 400 UNIT capsule Take 400 Units by mouth daily.          Review of Systems Review of Systems  Constitutional: Negative for fever, chills and unexpected weight change.  HENT: Negative for hearing loss, congestion, sore throat, trouble swallowing and voice change.   Eyes: Negative for visual disturbance.  Respiratory: Negative for cough and wheezing.   Cardiovascular: Negative for chest pain, palpitations and leg swelling.  Gastrointestinal: Negative for nausea, vomiting, abdominal pain, diarrhea, constipation, blood in stool, abdominal distention and anal bleeding.  Genitourinary: Negative for hematuria, vaginal bleeding and difficulty urinating.  Musculoskeletal: Positive for myalgias and arthralgias.  Skin: Negative for rash and wound.  Neurological: Negative for seizures, syncope and headaches.  Hematological: Negative for adenopathy. Does not bruise/bleed easily.  Psychiatric/Behavioral: Negative for confusion.    Blood pressure 166/70, pulse 70, temperature 98.1 F (36.7 C), temperature source Temporal, resp. rate 18, height 5\' 4"  (1.626 m), weight 179 lb 3.2 oz (81.285 kg).  Physical Exam Physical Exam  Constitutional: She is oriented to person, place, and time. She appears well-developed and well-nourished. No distress.  HENT:  Head: Normocephalic and atraumatic.  Nose: Nose normal.  Mouth/Throat: No oropharyngeal exudate.  Eyes: Conjunctivae and EOM are normal. Pupils are equal, round, and reactive to light. Left eye exhibits no discharge. No scleral icterus.  Neck: Neck supple. No JVD present. No tracheal  deviation present. No thyromegaly present.  Cardiovascular: Normal rate, regular rhythm, normal heart sounds and intact distal pulses.   No murmur heard. Pulmonary/Chest: Effort normal and breath sounds normal. No respiratory distress. She has no wheezes. She has no rales. She exhibits no tenderness.       Bilateral mastectomy scars well healed. No nodules, ulceration or adenopathy bilaterally.  Abdominal: Soft. Bowel sounds are normal. She exhibits no distension and no mass. There is no tenderness. There is no rebound and no guarding.  Musculoskeletal: She exhibits edema. She exhibits no tenderness.       Mild lymphedema RUE, no skin problems.  Lymphadenopathy:    She has no cervical adenopathy.  Neurological: She is alert and oriented to person, place, and time. She exhibits normal muscle tone. Coordination normal.  Skin: Skin is warm. No rash noted. She is not diaphoretic. No erythema. No pallor.  Psychiatric: She has a normal mood and affect. Her behavior is normal. Judgment and thought content normal.    Data Reviewed  I reviewed all of my old records and I reviewed Dr. Bryn Gulling incisions recent office note  Assessment    Invasive breast cancer left breast, stage IIIB, no evidence of recurrence 20 years following left modified radical mastectomy.  Right breast cancer, stage IIIC, receptor positive, no evidence of recurrence following right modified radical mastectomy February 04, 2004.    Plan    We discussed long-term surveillance, which I think is important. It was her preference to see Dr. Arline Asp every 6 months and to see me yearly. We will comply with her request.  She will return to see me in one year  Navi Ewton M. Derrell Lolling, [name], [facility] General and Minimally invasive Surgery Breast and Colorectal Surgery Office:   [phone] Pager:   [phone]  .   08/28/2011, 9:43 AM

## 2011-11-09 ENCOUNTER — Telehealth: Payer: Self-pay | Admitting: Oncology

## 2011-11-09 NOTE — Telephone Encounter (Signed)
s/w pt and r/s 4/15 to 5/6  aom

## 2011-11-21 ENCOUNTER — Other Ambulatory Visit: Payer: Medicare Other | Admitting: Lab

## 2011-11-21 ENCOUNTER — Ambulatory Visit: Payer: Medicare Other | Admitting: Oncology

## 2011-12-11 ENCOUNTER — Other Ambulatory Visit (HOSPITAL_BASED_OUTPATIENT_CLINIC_OR_DEPARTMENT_OTHER): Payer: Medicare Other | Admitting: Lab

## 2011-12-11 ENCOUNTER — Ambulatory Visit (HOSPITAL_COMMUNITY)
Admission: RE | Admit: 2011-12-11 | Discharge: 2011-12-11 | Disposition: A | Discharge status: Home / Self Care | Payer: Medicare Other | Source: Ambulatory Visit | Attending: Oncology | Admitting: Oncology

## 2011-12-11 ENCOUNTER — Ambulatory Visit (HOSPITAL_BASED_OUTPATIENT_CLINIC_OR_DEPARTMENT_OTHER): Payer: Medicare Other | Admitting: Oncology

## 2011-12-11 ENCOUNTER — Encounter: Payer: Self-pay | Admitting: Oncology

## 2011-12-11 VITALS — BP 171/82 | HR 88 | Temp 97.1°F | Ht 64.0 in | Wt 173.2 lb

## 2011-12-11 DIAGNOSIS — Z9010 Acquired absence of unspecified breast and nipple: Secondary | ICD-10-CM | POA: Insufficient documentation

## 2011-12-11 DIAGNOSIS — C50919 Malignant neoplasm of unspecified site of unspecified female breast: Secondary | ICD-10-CM | POA: Insufficient documentation

## 2011-12-11 DIAGNOSIS — I10 Essential (primary) hypertension: Secondary | ICD-10-CM | POA: Insufficient documentation

## 2011-12-11 DIAGNOSIS — Z853 Personal history of malignant neoplasm of breast: Secondary | ICD-10-CM

## 2011-12-11 DIAGNOSIS — Z170 Estrogen receptor positive status [ER+]: Secondary | ICD-10-CM

## 2011-12-11 DIAGNOSIS — E119 Type 2 diabetes mellitus without complications: Secondary | ICD-10-CM | POA: Insufficient documentation

## 2011-12-11 LAB — COMPREHENSIVE METABOLIC PANEL
ALT: 15 U/L (ref 0–35)
AST: 19 U/L (ref 0–37)
Albumin: 3.7 g/dL (ref 3.5–5.2)
Alkaline Phosphatase: 61 U/L (ref 39–117)
BUN: 20 mg/dL (ref 6–23)
CO2: 24 mEq/L (ref 19–32)
Calcium: 9.3 mg/dL (ref 8.4–10.5)
Chloride: 106 mEq/L (ref 96–112)
Creatinine, Ser: 1.02 mg/dL (ref 0.50–1.10)
Glucose, Bld: 120 mg/dL — ABNORMAL HIGH (ref 70–99)
Potassium: 4.2 mEq/L (ref 3.5–5.3)
Sodium: 140 mEq/L (ref 135–145)
Total Bilirubin: 0.3 mg/dL (ref 0.3–1.2)
Total Protein: 7.4 g/dL (ref 6.0–8.3)

## 2011-12-11 LAB — CBC WITH DIFFERENTIAL/PLATELET
BASO%: 1.1 % (ref 0.0–2.0)
Basophils Absolute: 0.1 10*3/uL (ref 0.0–0.1)
EOS%: 2.3 % (ref 0.0–7.0)
Eosinophils Absolute: 0.2 10*3/uL (ref 0.0–0.5)
HCT: 36.4 % (ref 34.8–46.6)
HGB: 12.1 g/dL (ref 11.6–15.9)
LYMPH%: 21.5 % (ref 14.0–49.7)
MCH: 30.6 pg (ref 25.1–34.0)
MCHC: 33.3 g/dL (ref 31.5–36.0)
MCV: 91.9 fL (ref 79.5–101.0)
MONO#: 0.6 10*3/uL (ref 0.1–0.9)
MONO%: 7.1 % (ref 0.0–14.0)
NEUT#: 6 10*3/uL (ref 1.5–6.5)
NEUT%: 68 % (ref 38.4–76.8)
Platelets: 245 10*3/uL (ref 145–400)
RBC: 3.96 10*6/uL (ref 3.70–5.45)
RDW: 13.9 % (ref 11.2–14.5)
WBC: 8.8 10*3/uL (ref 3.9–10.3)
lymph#: 1.9 10*3/uL (ref 0.9–3.3)
nRBC: 0 % (ref 0–0)

## 2011-12-11 LAB — LACTATE DEHYDROGENASE: LDH: 148 U/L (ref 94–250)

## 2011-12-11 NOTE — Progress Notes (Signed)
This office note has been dictated.  #[id number]

## 2011-12-12 NOTE — Progress Notes (Signed)
CC:   Morgan [name], [name] Morgan [name], [name] Morgan [name]. Morgan [name], [name]  PROBLEM LIST: 1. Carcinoma of the right breast diagnosed in June 2005, stage IIIC     with 15/19 positive lymph nodes.  Estrogen and progesterone     receptors were strongly positive.  HER-2/neu was negative by FISH.     The patient underwent a right modified radical mastectomy on     02/04/2004.  She received 5 cycles of adjuvant chemotherapy with     epirubicin, Cytoxan and Taxotere.  Treatments were initiated on     03/15/2004 concluded May 11, 2004.  The patient then received     radiation treatments from June 13, 2004 through July 28, 2004.  She was then on adjuvant Arimidex from August 15, 2004     through January 2011 and has remained disease free. 2. Left breast carcinoma dating back to August 1989, stage IIIB status     post left modified radical mastectomy on 03/31/1988.  There was     dermal lymphatic invasion pathologically.  Also peau d'orange. 4/11     lymph nodes were positive.  Estrogen receptor was 29, progesterone     receptor 43.  The patient received adjuvant chemotherapy with CAF     for 6 cycles from September 1989 through January 1990.  Cumulative     Adriamycin was 670 mg equal to 335 mg per m squared.  The patient     then received radiation treatments 5040 cGy plus a 540 cGy boost     from August 28, 1988 through October 09, 1988. 3. History of lymphedema of the right upper extremity in June 2007     [facility] 4. Diabetes mellitus. 5. Hypertension. 6. Hypothyroidism. 7. Dyslipidemia. 8. Obesity. 9. Osteopenia.  MEDICATIONS: 1. Os-Cal 600 mg twice daily. 2. Plavix 75 mg daily. 3. Levothyroxine 88 mcg daily. 4. Lisinopril 5 mg daily. 5. Metformin 1000 mg twice daily. 6. Toprol-XL 12.5 mg daily. 7. Multivitamins (Theragran) 1 mL daily. 8. Zocor 10 mg at bedtime.  HISTORY:  I saw Morgan [name] today for followup of her history of bilateral  breast cancer both locally advanced, currently without evidence of disease.  The initial cancer involved the left breast, stage IIIB dates back to August 1989 and the 2nd cancer involving the right breast stage IIIC dates back to June 2005.  The patient has had bilateral mastectomies, adjuvant chemotherapy and received Arimidex for 5 years until January 2011.  She is now 78 years old, is independent, drives, lives on her own.  She is without any complaints today.  She is trying to lose weight and has been successful.  There are no symptoms to suggest recurrent breast cancer.  PHYSICAL EXAM:  The patient looks well.  Weight is 173 pounds as compared with 174 pounds 6 months ago, and 183 pounds a year ago. Height 5 feet 4 inches, body surface area 1.88 sq/m.  Blood pressure 171/82.  Other vital signs are normal.  Hair is thinning.  No scleral icterus.  Mouth and pharynx are benign.  No peripheral adenopathy palpable.  Heart and lungs:  Normal.  The patient has undergone bilateral mastectomies.  No evidence for chest wall recurrence.  No axillary adenopathy.  Abdomen:  Obese, nontender with no organomegaly or masses palpable.  Extremities:  No peripheral edema or clubbing.  No obvious lymphedema of either arm although there is a history of lymphedema involving the right  upper arm.  The patient needs a little bit of assistance getting up and down from the examining table, but neurologic exam is grossly normal.  LABORATORY DATA:  Today, white count 8.8, ANC 6.0, hemoglobin 12.1, hematocrit 36.4, platelets 245,000.  Chemistries today were normal.  IMAGING STUDIES: 1. Chest x-ray, 2 view, from 11/18/2010 showed no active disease.     There was evidence of bilateral mastectomies and axillary lymph     node dissection. 2. Chest x-ray, 2 view, from 12/11/2011 showed no acute abnormalities     and no evidence for recurrent breast cancer.  IMPRESSION AND PLAN:  Morgan [name] continues to do  extraordinarily well with no signs of recurrent breast cancer, now approaching 24 years from the time of diagnosis of her 1st breast cancer involving the left breast and now 8 years from the time of diagnosis of her most recent cancer involving the right breast.  She remains disease free, and in apparent good health despite several medical problems that seem to be under good control.  Arimidex was discontinued a little more than 2 years ago.  As noted above, we obtained a chest x-ray today that was negative.  Will plan to see Morgan [name] again in 6 months at which time will check CBC and chemistries.    ______________________________ [name], [name] DSM/MEDQ  D:  12/11/2011  T:  12/12/2011  Job:  [id number]

## 2011-12-13 ENCOUNTER — Telehealth: Payer: Self-pay | Admitting: Oncology

## 2011-12-13 ENCOUNTER — Other Ambulatory Visit: Payer: Self-pay | Admitting: Oncology

## 2011-12-13 NOTE — Telephone Encounter (Signed)
pt called in to make her 17mo appt    aom

## 2011-12-15 ENCOUNTER — Telehealth: Payer: Self-pay

## 2011-12-15 NOTE — Telephone Encounter (Signed)
S/w pt that CXR on Monday was fine.

## 2012-01-16 ENCOUNTER — Telehealth: Payer: Self-pay | Admitting: *Deleted

## 2012-01-16 NOTE — Telephone Encounter (Signed)
Error

## 2012-04-18 ENCOUNTER — Encounter: Payer: Self-pay | Admitting: Oncology

## 2012-04-18 DIAGNOSIS — M8580 Other specified disorders of bone density and structure, unspecified site: Secondary | ICD-10-CM

## 2012-04-18 DIAGNOSIS — C50919 Malignant neoplasm of unspecified site of unspecified female breast: Secondary | ICD-10-CM

## 2012-04-18 NOTE — Progress Notes (Signed)
A bone density scan was carried out at [facility] on 04/10/2012.  T score of the right femoral neck was -1.9, which was the lowest measured site. T score of the left femoral neck was -1.5 and the T score is of the lumbar spine were all in the positive range. There had been a statistically significant interval increase in the bone mineral density of the lumbar spine of 13.7%, compared to the exam of 09/28/2009. It was speculated that this could be secondary to degenerative sclerosis.  Bone mineral density of the right femur has been generally stable with some fluctuations when compared with the exam of 09/24/2006.  Bone mineral density of the left femur has been progressively decreasing going back to 09/05/2004. At that time bone mineral density was 0.891. Currently it is 0.795.  The patient is on Os-Cal 600 mg twice daily. I don't see where she is on vitamin D nor do I see where we had checked a vitamin D level in the past.  I have added a vitamin D level for her next appointment with me on November 7.  A copy of the bone density report was apparently sent to Dr. Thayer Headings. We will call the patient about this report and encourage her to discuss her osteopenia with her primary physician.

## 2012-04-22 ENCOUNTER — Telehealth: Payer: Self-pay | Admitting: Medical Oncology

## 2012-04-22 NOTE — Telephone Encounter (Signed)
I called pt and left her a message that Dr. Arline Asp sent a copy of her bone density to her primary Dr. Thayer Headings. He would like for her to follow up with her to discuss her osteopenia. I asked her to call me if any questions. I also let her know that Dr. Arline Asp has added a Vit D level to her labs with her November 7 th appointment in our office.

## 2012-05-28 ENCOUNTER — Other Ambulatory Visit: Payer: Self-pay | Admitting: Dermatology

## 2012-06-13 ENCOUNTER — Encounter: Payer: Self-pay | Admitting: Oncology

## 2012-06-13 ENCOUNTER — Ambulatory Visit (HOSPITAL_BASED_OUTPATIENT_CLINIC_OR_DEPARTMENT_OTHER): Payer: Medicare Other | Admitting: Oncology

## 2012-06-13 ENCOUNTER — Telehealth: Payer: Self-pay | Admitting: Oncology

## 2012-06-13 ENCOUNTER — Other Ambulatory Visit (HOSPITAL_BASED_OUTPATIENT_CLINIC_OR_DEPARTMENT_OTHER): Payer: Medicare Other | Admitting: Lab

## 2012-06-13 VITALS — BP 155/62 | HR 86 | Temp 97.4°F | Resp 18 | Ht 64.0 in | Wt 169.5 lb

## 2012-06-13 DIAGNOSIS — M8580 Other specified disorders of bone density and structure, unspecified site: Secondary | ICD-10-CM

## 2012-06-13 DIAGNOSIS — D649 Anemia, unspecified: Secondary | ICD-10-CM

## 2012-06-13 DIAGNOSIS — Z853 Personal history of malignant neoplasm of breast: Secondary | ICD-10-CM

## 2012-06-13 DIAGNOSIS — M899 Disorder of bone, unspecified: Secondary | ICD-10-CM

## 2012-06-13 DIAGNOSIS — C50919 Malignant neoplasm of unspecified site of unspecified female breast: Secondary | ICD-10-CM

## 2012-06-13 LAB — COMPREHENSIVE METABOLIC PANEL (CC13)
ALT: 16 U/L (ref 0–55)
AST: 18 U/L (ref 5–34)
Albumin: 3.5 g/dL (ref 3.5–5.0)
Alkaline Phosphatase: 55 U/L (ref 40–150)
BUN: 17 mg/dL (ref 7.0–26.0)
CO2: 27 mEq/L (ref 22–29)
Calcium: 9.1 mg/dL (ref 8.4–10.4)
Chloride: 110 mEq/L — ABNORMAL HIGH (ref 98–107)
Creatinine: 1 mg/dL (ref 0.6–1.1)
Glucose: 104 mg/dl — ABNORMAL HIGH (ref 70–99)
Potassium: 3.8 mEq/L (ref 3.5–5.1)
Sodium: 141 mEq/L (ref 136–145)
Total Bilirubin: 0.47 mg/dL (ref 0.20–1.20)
Total Protein: 6.6 g/dL (ref 6.4–8.3)

## 2012-06-13 LAB — LACTATE DEHYDROGENASE (CC13): LDH: 158 U/L (ref 125–245)

## 2012-06-13 LAB — CBC WITH DIFFERENTIAL/PLATELET
BASO%: 1.1 % (ref 0.0–2.0)
Basophils Absolute: 0.1 10*3/uL (ref 0.0–0.1)
EOS%: 3.8 % (ref 0.0–7.0)
Eosinophils Absolute: 0.3 10*3/uL (ref 0.0–0.5)
HCT: 33.2 % — ABNORMAL LOW (ref 34.8–46.6)
HGB: 11.6 g/dL (ref 11.6–15.9)
LYMPH%: 24.3 % (ref 14.0–49.7)
MCH: 32.1 pg (ref 25.1–34.0)
MCHC: 35 g/dL (ref 31.5–36.0)
MCV: 91.9 fL (ref 79.5–101.0)
MONO#: 0.8 10*3/uL (ref 0.1–0.9)
MONO%: 10.4 % (ref 0.0–14.0)
NEUT#: 4.4 10*3/uL (ref 1.5–6.5)
NEUT%: 60.4 % (ref 38.4–76.8)
Platelets: 229 10*3/uL (ref 145–400)
RBC: 3.61 10*6/uL — ABNORMAL LOW (ref 3.70–5.45)
RDW: 13.8 % (ref 11.2–14.5)
WBC: 7.3 10*3/uL (ref 3.9–10.3)
lymph#: 1.8 10*3/uL (ref 0.9–3.3)

## 2012-06-13 LAB — VITAMIN D 25 HYDROXY (VIT D DEFICIENCY, FRACTURES): Vit D, 25-Hydroxy: 41 ng/mL (ref 30–89)

## 2012-06-13 NOTE — Progress Notes (Signed)
This office note has been dictated.  #[id number]

## 2012-06-13 NOTE — Telephone Encounter (Signed)
appts made and printed for pt aom °

## 2012-06-14 NOTE — Progress Notes (Signed)
CC:   Thayer Headings, [name] Maryln Gottron, [name] Angelia Mould. Derrell Lolling, [name]  PROBLEM LIST:  1. Carcinoma of the right breast diagnosed in June 2005, stage IIIC  with 15/19 positive lymph nodes. Estrogen and progesterone  receptors were strongly positive. HER-2/neu was negative by FISH.  The patient underwent a right modified radical mastectomy on  02/04/2004. She received 5 cycles of adjuvant chemotherapy with  epirubicin, Cytoxan and Taxotere. Treatments were initiated on  03/15/2004 concluded May 11, 2004. The patient then received  radiation treatments from June 13, 2004 through July 28, 2004. She was then on adjuvant Arimidex from August 15, 2004  through January 2011 and has remained disease free.  2. Left breast carcinoma dating back to August 1989, stage IIIB status  post left modified radical mastectomy on 03/31/1988. There was  dermal lymphatic invasion pathologically. Also peau d'orange. 4/11  lymph nodes were positive. Estrogen receptor was 29, progesterone  receptor 43. The patient received adjuvant chemotherapy with CAF  for 6 cycles from September 1989 through January 1990. Cumulative  Adriamycin was 670 mg equal to 335 mg per m squared. The patient  then received radiation treatments 5040 cGy plus a 540 cGy boost  from August 28, 1988 through October 09, 1988.  3. History of lymphedema of the right upper extremity in June 2007  [facility]  4. Diabetes mellitus.  5. Hypertension.  6. Hypothyroidism.  7. Dyslipidemia.  8. Obesity.  9. Osteopenia. 10. Mild anemia noted on 06/13/2012. 11. 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 Arminda Resides on 05/28/2012.   MEDICATIONS:  1. Os-Cal 600 mg 3 tablets daily.  2. Plavix 75 mg daily.  3. Levothyroxine 88 mcg daily.  4. Lisinopril 10 mg daily.  5. Metformin 1000 mg twice daily.  6. Toprol-XL 12.5 mg daily.   7. Multivitamins (Theragran) 1 mL daily.  8. Zocor 10 mg at bedtime.  PNEUMOVAX:  Given about 2009. FLU SHOT:  Given on 05/15/2012.  SMOKING HISTORY:  The patient has never smoked cigarettes.   HISTORY:  Morgan [name] was seen today for followup of her history of bilateral breast cancers, both of which were locally advanced, currently without evidence of disease and on no therapy.  The patient tells me she is due for a colonoscopy by Dr. Matthias Hughs tomorrow.  Her last colonoscopy was about 10 years ago.  She denies any change in her clinical status. She was last seen by [place] on 12/11/2011.  She denies any evidence of blood in her stools or melena.  No symptoms to suggest recurrent breast cancer.  The patient now has her daughter's family living with her.  She continues to drive and be fairly independent.  PHYSICAL EXAMINATION:  General:  The patient looks well.  She celebrated her 83st birthday yesterday.  Weight today is 169.5 pounds.  Weight is fairly stable from 6 months ago.  Height 5 feet 4 inches, body surface area 1.86 sq m.  Vital Signs:  Blood pressure 155/62.  Other vital signs are normal.  HEENT:  There is no scleral icterus.  Mouth and pharynx are benign.  No peripheral adenopathy palpable.  Lungs:  Clear to percussion and auscultation.  Cardiac:  There may have been a very faint systolic ejection murmur.  Rhythm is regular.  Chest:  The patient has undergone bilateral mastectomies with no evidence for chest wall recurrence.  No axillary  adenopathy.  Abdomen:  Benign with no organomegaly or masses palpable.  Extremities:  No peripheral edema or clubbing.  There is no obvious lymphedema in either arm, although the patient tells me that she has had some swelling of her right upper arm.  Neurologic:  Nonfocal. The patient did need some assistance getting up and down from the examining table.  I believe she has some arthritis in her knees.  LABORATORY DATA:  White count 7.3,  ANC 4.4, hemoglobin 11.6, hematocrit 33.2, platelets 229,000.  Chemistries today were essentially normal.  IMAGING STUDIES:  1. Chest x-ray, 2 view, from 11/18/2010 showed no active disease.  There was evidence of bilateral mastectomies and axillary lymph  node dissection.  2. Chest x-ray, 2 view, from 12/11/2011 showed no acute abnormalities  and no evidence for recurrent breast cancer. 3. Bone density scan carried out at [facility] on 04/10/2012 showed T-score of -1.9 involving the right femoral neck.  There was a statistically significant interval increase in the bone mineral density of the lumbar spine of 13.7% compared to the exam of 09/28/2009.  T- score of the left femur was also -1.7, as it was for the right femur.  IMPRESSION AND PLAN:  Morgan [name] continues to do well with no signs of recurrent breast cancer, now over 24 years from the time of diagnosis of her first breast cancer involving the left breast and now about 8-1/2 years from the time of diagnosis of the cancer involving the right breast.  She remains disease-free and seems to be doing quite well. Arimidex was discontinued about 3 years ago, January 2011.  Of note today is the slight drop in the hemoglobin and hematocrit which today were 11.6 and 33.2, respectively, as compared with 12.1 and 36.4, respectively, on 12/11/2011 and 13.1 in 37.8, respectively, on 05/11/2011.  As noted, the patient is having a colonoscopy tomorrow by Dr. Matthias Hughs.  I have asked the patient to come back and have a CBC in 2 months.  If the hemoglobin and hematocrit are continuing to drop, then additional workup for the anemia will be indicated.  Red cell indices are normal and do not suggest an obvious etiology for the anemia.  Patient's renal function seems to be stable.  I have asked Morgan [name] to return in 1 year for a visit.  We will check labs and chemistries.  She can see Norina Buzzard at that  time.    ______________________________ [name], [name] DSM/MEDQ  D:  06/13/2012  T:  06/14/2012  Job:  [id number]

## 2012-08-13 ENCOUNTER — Other Ambulatory Visit: Payer: Medicare Other

## 2012-09-05 ENCOUNTER — Ambulatory Visit (INDEPENDENT_AMBULATORY_CARE_PROVIDER_SITE_OTHER): Payer: Medicare Other | Admitting: General Surgery

## 2012-09-06 ENCOUNTER — Encounter (INDEPENDENT_AMBULATORY_CARE_PROVIDER_SITE_OTHER): Payer: Self-pay | Admitting: General Surgery

## 2012-09-06 ENCOUNTER — Other Ambulatory Visit (HOSPITAL_BASED_OUTPATIENT_CLINIC_OR_DEPARTMENT_OTHER): Payer: Medicare Other

## 2012-09-06 ENCOUNTER — Ambulatory Visit (INDEPENDENT_AMBULATORY_CARE_PROVIDER_SITE_OTHER): Payer: Medicare Other | Admitting: General Surgery

## 2012-09-06 VITALS — BP 116/74 | HR 64 | Temp 97.9°F | Resp 18 | Ht 64.0 in | Wt 169.0 lb

## 2012-09-06 DIAGNOSIS — C50919 Malignant neoplasm of unspecified site of unspecified female breast: Secondary | ICD-10-CM

## 2012-09-06 LAB — CBC WITH DIFFERENTIAL/PLATELET
BASO%: 1 % (ref 0.0–2.0)
Basophils Absolute: 0.1 10*3/uL (ref 0.0–0.1)
EOS%: 2.6 % (ref 0.0–7.0)
Eosinophils Absolute: 0.3 10*3/uL (ref 0.0–0.5)
HCT: 34.2 % — ABNORMAL LOW (ref 34.8–46.6)
HGB: 11.6 g/dL (ref 11.6–15.9)
LYMPH%: 22.7 % (ref 14.0–49.7)
MCH: 31.1 pg (ref 25.1–34.0)
MCHC: 33.9 g/dL (ref 31.5–36.0)
MCV: 91.9 fL (ref 79.5–101.0)
MONO#: 0.8 10*3/uL (ref 0.1–0.9)
MONO%: 8.4 % (ref 0.0–14.0)
NEUT#: 6.6 10*3/uL — ABNORMAL HIGH (ref 1.5–6.5)
NEUT%: 65.3 % (ref 38.4–76.8)
Platelets: 253 10*3/uL (ref 145–400)
RBC: 3.72 10*6/uL (ref 3.70–5.45)
RDW: 13.7 % (ref 11.2–14.5)
WBC: 10 10*3/uL (ref 3.9–10.3)
lymph#: 2.3 10*3/uL (ref 0.9–3.3)

## 2012-09-06 NOTE — Progress Notes (Signed)
Patient ID: Morgan [name], female   DOB: 1928/12/25, 79 y.o.   MRN: [id number] History: Morgan [name] returns for long-term followup regarding her bilateral breast cancer. In 1992 she underwent left modified radical mastectomy for stage IIIB breast cancer. She had postop radiation therapy. In 2005 she had a right modified radical mastectomy for stage IIIc breast cancer, receptor positive. She had postop radiation therapy and in 5 years of arimadex.   She is followed by Dr. Kimberlee Nearing  and Dr. Malachi Carl.. She says that her health has been stable and she has had a good year. All of her medications are unchanged. The swelling in her right arm is actually better. She continues to work in her garden and drives her car wherever she wants.  Exam: Morgan [name] looks good. Alert. Elderly. Care is gray and thinning. Neck without adenopathy no jugular venous distention Lungs clear to auscultation bilaterally Heart regular rate and rhythm no murmur no ectopy Breasts bilateral mastectomy scars well healed. Tissues are soft. No nodules no ulceration. No axillary mass. 100% range of motion of both shoulders.  Assessment: Invasive cancer of the breast, bilateral, no evidence of recurrence   Plan: I talked to her about surveillance. She says that she was to see all 3 physicians until she is 10 years following her right mastectomy. Because of that I will see her in one year.It is always a pleasure to see her    Angelia Mould. Derrell Lolling, [name], [facility] General and Minimally invasive Surgery Breast and Colorectal Surgery Office:   [phone] Pager:   [phone]

## 2012-09-06 NOTE — Patient Instructions (Signed)
Your physical exam today is completely normal. There is no evidence of cancer.  Return to see Dr. Derrell Lolling in one year

## 2013-05-13 ENCOUNTER — Telehealth: Payer: Self-pay | Admitting: Internal Medicine

## 2013-05-13 NOTE — Telephone Encounter (Signed)
Pt called and r/s 11/4 lb/fu to 11/7 @ 10am.

## 2013-06-10 ENCOUNTER — Ambulatory Visit: Payer: Medicare Other

## 2013-06-10 ENCOUNTER — Ambulatory Visit: Payer: Medicare Other | Admitting: Family

## 2013-06-10 ENCOUNTER — Other Ambulatory Visit: Payer: Medicare Other | Admitting: Lab

## 2013-06-12 ENCOUNTER — Other Ambulatory Visit: Payer: Self-pay

## 2013-06-12 DIAGNOSIS — C50919 Malignant neoplasm of unspecified site of unspecified female breast: Secondary | ICD-10-CM

## 2013-06-13 ENCOUNTER — Ambulatory Visit (HOSPITAL_BASED_OUTPATIENT_CLINIC_OR_DEPARTMENT_OTHER): Payer: Medicare Other | Admitting: Internal Medicine

## 2013-06-13 ENCOUNTER — Encounter (INDEPENDENT_AMBULATORY_CARE_PROVIDER_SITE_OTHER): Payer: Self-pay

## 2013-06-13 ENCOUNTER — Telehealth: Payer: Self-pay | Admitting: Internal Medicine

## 2013-06-13 ENCOUNTER — Other Ambulatory Visit (HOSPITAL_BASED_OUTPATIENT_CLINIC_OR_DEPARTMENT_OTHER): Payer: Medicare Other | Admitting: Lab

## 2013-06-13 VITALS — BP 163/84 | HR 82 | Temp 97.0°F | Resp 18 | Ht 64.0 in | Wt 164.2 lb

## 2013-06-13 DIAGNOSIS — C50919 Malignant neoplasm of unspecified site of unspecified female breast: Secondary | ICD-10-CM

## 2013-06-13 DIAGNOSIS — D649 Anemia, unspecified: Secondary | ICD-10-CM

## 2013-06-13 DIAGNOSIS — Z853 Personal history of malignant neoplasm of breast: Secondary | ICD-10-CM

## 2013-06-13 DIAGNOSIS — M899 Disorder of bone, unspecified: Secondary | ICD-10-CM

## 2013-06-13 DIAGNOSIS — M8580 Other specified disorders of bone density and structure, unspecified site: Secondary | ICD-10-CM

## 2013-06-13 LAB — COMPREHENSIVE METABOLIC PANEL (CC13)
ALT: 10 U/L (ref 0–55)
AST: 17 U/L (ref 5–34)
Albumin: 3.2 g/dL — ABNORMAL LOW (ref 3.5–5.0)
Alkaline Phosphatase: 60 U/L (ref 40–150)
Anion Gap: 11 mEq/L (ref 3–11)
BUN: 20.3 mg/dL (ref 7.0–26.0)
CO2: 25 mEq/L (ref 22–29)
Calcium: 9.5 mg/dL (ref 8.4–10.4)
Chloride: 108 mEq/L (ref 98–109)
Creatinine: 1.1 mg/dL (ref 0.6–1.1)
Glucose: 122 mg/dl (ref 70–140)
Potassium: 4.4 mEq/L (ref 3.5–5.1)
Sodium: 144 mEq/L (ref 136–145)
Total Bilirubin: 0.41 mg/dL (ref 0.20–1.20)
Total Protein: 6.9 g/dL (ref 6.4–8.3)

## 2013-06-13 LAB — CBC WITH DIFFERENTIAL/PLATELET
BASO%: 1.5 % (ref 0.0–2.0)
Basophils Absolute: 0.1 10*3/uL (ref 0.0–0.1)
EOS%: 3.5 % (ref 0.0–7.0)
Eosinophils Absolute: 0.3 10*3/uL (ref 0.0–0.5)
HCT: 34.4 % — ABNORMAL LOW (ref 34.8–46.6)
HGB: 11.5 g/dL — ABNORMAL LOW (ref 11.6–15.9)
LYMPH%: 24.8 % (ref 14.0–49.7)
MCH: 30.5 pg (ref 25.1–34.0)
MCHC: 33.5 g/dL (ref 31.5–36.0)
MCV: 91.2 fL (ref 79.5–101.0)
MONO#: 0.8 10*3/uL (ref 0.1–0.9)
MONO%: 10.5 % (ref 0.0–14.0)
NEUT#: 4.3 10*3/uL (ref 1.5–6.5)
NEUT%: 59.7 % (ref 38.4–76.8)
Platelets: 245 10*3/uL (ref 145–400)
RBC: 3.77 10*6/uL (ref 3.70–5.45)
RDW: 13.5 % (ref 11.2–14.5)
WBC: 7.2 10*3/uL (ref 3.9–10.3)
lymph#: 1.8 10*3/uL (ref 0.9–3.3)

## 2013-06-13 LAB — LACTATE DEHYDROGENASE (CC13): LDH: 152 U/L (ref 125–245)

## 2013-06-13 NOTE — Progress Notes (Signed)
[facility] OFFICE PROGRESS NOTE  Thayer Headings, MD [address], [address] [place] [address]  DIAGNOSIS: Breast cancer, unspecified laterality - Plan: CBC with Differential, Comprehensive metabolic panel  Osteopenia - Plan: CANCELED: Vitamin B12 Deficiency Panel - [facility]  Anemia, unspecified  Chief Complaint  Patient presents with  . Breast Cancer    CURRENT THERAPY: Observation.   INTERVAL HISTORY: Morgan [name] 79 y.o. female with a history of bilateral breast cancers, both of which were locally advanced, currently without evidence of disease and on no therapy is here for follow-up. She underwent her colonoscopy with Dr. Matthias Hughs last year and was reported to be within normal limits.  She denies any change in her clinical status. She was last seen by Dr  Arline Asp on 06/13/2012. She remains very active working in her tiny garden and tending to her flowers.  She lives her daughter and son-in-law.  Her weight is stable.  She denies any recent hospitalizations or emergency room visits.  She denies any fevers or chills.  She denies any evidence of blood in her stools or melena. No symptoms to suggest recurrent breast cancer.   MEDICAL HISTORY: Past Medical History  Diagnosis Date  . Hearing loss   . Cancer     breast bilateral  . Hyperlipidemia   . Hypertension   . Diabetes mellitus without complication   . Thyroid disease     hypothyroidism    INTERIM HISTORY: has Breast cancer; Osteopenia; and Anemia, unspecified on her problem list.    ALLERGIES:  has No Known Allergies.  MEDICATIONS: has a current medication list which includes the following prescription(s): calcium-vitamin d, clopidogrel, levothyroxine, lisinopril, metformin, metoprolol succinate, multivitamin, and simvastatin.  SURGICAL HISTORY:  Past Surgical History  Procedure Laterality Date  . Mastectomy  1989/2005  . Cholecystectomy  1978?  Marland Kitchen Squamous cell carcinoma excision      PROBLEM LIST:  1. Carcinoma of the right breast diagnosed in June 2005, stage IIIC with 15/19 positive lymph nodes. Estrogen and progesterone receptors were strongly positive. HER-2/neu was negative by FISH.  The patient underwent a right modified radical mastectomy on 02/04/2004. She received 5 cycles of adjuvant chemotherapy with epirubicin, Cytoxan and Taxotere. Treatments were initiated on  03/15/2004 concluded May 11, 2004. The patient then received radiation treatments from June 13, 2004 through July 28, 2004. She was then on adjuvant Arimidex from August 15, 2004  through January 2011 and has remained disease free.  2. Left breast carcinoma dating back to August 1989, stage IIIB status post left modified radical mastectomy on 03/31/1988. There was dermal lymphatic invasion pathologically. Also peau d'orange. 4/11 lymph nodes were positive. Estrogen receptor was 29, progesterone receptor 43. The patient received adjuvant chemotherapy with CAF for 6 cycles from September 1989 through January 1990. Cumulative Adriamycin was 670 mg equal to 335 mg per m squared. The patient then received radiation treatments 5040 cGy plus a 540 cGy boost from August 28, 1988 through October 09, 1988.  3. History of lymphedema of the right upper extremity in June 2007 [facility]  4. Diabetes mellitus.  5. Hypertension.  6. Hypothyroidism.  7. Dyslipidemia.  8. Obesity.  9. Osteopenia.  10. Mild anemia noted on 06/13/2012.  11. 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 Arminda Resides  on 05/28/2012.    REVIEW OF SYSTEMS:   Constitutional: Denies fevers, chills or abnormal weight  loss Eyes: Denies blurriness of vision Ears, nose, mouth, throat, and face: Denies mucositis or sore throat Respiratory: Denies cough, dyspnea or wheezes Cardiovascular: Denies palpitation, chest discomfort or lower  extremity swelling Gastrointestinal:  Denies nausea, heartburn or change in bowel habits Skin: Denies abnormal skin rashes Lymphatics: Denies new lymphadenopathy or easy bruising Neurological:Denies numbness, tingling or new weaknesses Behavioral/Psych: Mood is stable, no new changes  All other systems were reviewed with the patient and are negative.  PHYSICAL EXAMINATION: ECOG PERFORMANCE STATUS: 0 - Asymptomatic  Blood pressure 163/84, pulse 82, temperature 97 F (36.1 C), temperature source Oral, resp. rate 18, height 5\' 4"  (1.626 m), weight 164 lb 3.2 oz (74.481 kg), SpO2 97.00%.  GENERAL:alert, no distress and comfortable; elderly female.  Cautions gait.  SKIN: skin color, texture, turgor are normal, no rashes or significant lesions EYES: normal, Conjunctiva are pink and non-injected, sclera clear OROPHARYNX:no exudate, no erythema and lips, buccal mucosa, and tongue normal  NECK: supple, thyroid normal size, non-tender, without nodularity LYMPH:  no palpable lymphadenopathy in the cervical, axillary or supraclavicular LUNGS: clear to auscultation and percussion with normal breathing effort HEART: regular rate & rhythm and no murmurs and no lower extremity edema BREAST: Both her right and left  chest wall reveals scars well healed without evidence of recurrence within the chest wall and without lymphadenopathy.  ABDOMEN:abdomen soft, non-tender and normal bowel sounds Musculoskeletal:no cyanosis of digits and no clubbing  NEURO: alert & oriented x 3 with fluent speech, no focal motor/sensory deficits   LABORATORY DATA: Results for orders placed in visit on 06/13/13 (from the past 48 hour(s))  CBC WITH DIFFERENTIAL     Status: Abnormal   Collection Time    06/13/13  9:58 AM      Result Value Range   WBC 7.2  3.9 - 10.3 10e3/uL   NEUT# 4.3  1.5 - 6.5 10e3/uL   HGB 11.5 (*) 11.6 - 15.9 g/dL   HCT 52.8 (*) 41.3 - 24.4 %   Platelets 245  145 - 400 10e3/uL   MCV 91.2  79.5 -  101.0 fL   MCH 30.5  25.1 - 34.0 pg   MCHC 33.5  31.5 - 36.0 g/dL   RBC 0.10  2.72 - 5.36 10e6/uL   RDW 13.5  11.2 - 14.5 %   lymph# 1.8  0.9 - 3.3 10e3/uL   MONO# 0.8  0.1 - 0.9 10e3/uL   Eosinophils Absolute 0.3  0.0 - 0.5 10e3/uL   Basophils Absolute 0.1  0.0 - 0.1 10e3/uL   NEUT% 59.7  38.4 - 76.8 %   LYMPH% 24.8  14.0 - 49.7 %   MONO% 10.5  0.0 - 14.0 %   EOS% 3.5  0.0 - 7.0 %   BASO% 1.5  0.0 - 2.0 %  COMPREHENSIVE METABOLIC PANEL (CC13)     Status: Abnormal   Collection Time    06/13/13  9:58 AM      Result Value Range   Sodium 144  136 - 145 mEq/L   Potassium 4.4  3.5 - 5.1 mEq/L   Chloride 108  98 - 109 mEq/L   CO2 25  22 - 29 mEq/L   Glucose 122  70 - 140 mg/dl   BUN 64.4  7.0 - 03.4 mg/dL   Creatinine 1.1  0.6 - 1.1 mg/dL   Total Bilirubin 7.42  0.20 - 1.20 mg/dL   Alkaline Phosphatase 60  40 - 150 U/L   AST 17  5 -  34 U/L   ALT 10  0 - 55 U/L   Total Protein 6.9  6.4 - 8.3 g/dL   Albumin 3.2 (*) 3.5 - 5.0 g/dL   Calcium 9.5  8.4 - 16.1 mg/dL   Anion Gap 11  3 - 11 mEq/L    Labs:  Lab Results  Component Value Date   WBC 7.2 06/13/2013   HGB 11.5* 06/13/2013   HCT 34.4* 06/13/2013   MCV 91.2 06/13/2013   PLT 245 06/13/2013   NEUTROABS 4.3 06/13/2013      Chemistry      Component Value Date/Time   NA 144 06/13/2013 0958   NA 140 12/11/2011 1510   K 4.4 06/13/2013 0958   K 4.2 12/11/2011 1510   CL 110* 06/13/2012 1008   CL 106 12/11/2011 1510   CO2 25 06/13/2013 0958   CO2 24 12/11/2011 1510   BUN 20.3 06/13/2013 0958   BUN 20 12/11/2011 1510   CREATININE 1.1 06/13/2013 0958   CREATININE 1.02 12/11/2011 1510      Component Value Date/Time   CALCIUM 9.5 06/13/2013 0958   CALCIUM 9.3 12/11/2011 1510   ALKPHOS 60 06/13/2013 0958   ALKPHOS 61 12/11/2011 1510   AST 17 06/13/2013 0958   AST 19 12/11/2011 1510   ALT 10 06/13/2013 0958   ALT 15 12/11/2011 1510   BILITOT 0.41 06/13/2013 0958   BILITOT 0.3 12/11/2011 1510     Basic Metabolic Panel:  Recent Labs Lab 06/13/13 0958   NA 144  K 4.4  CO2 25  GLUCOSE 122  BUN 20.3  CREATININE 1.1  CALCIUM 9.5   GFR Estimated Creatinine Clearance: 39 ml/min (by C-G formula based on Cr of 1.1). Liver Function Tests:  Recent Labs Lab 06/13/13 0958  AST 17  ALT 10  ALKPHOS 60  BILITOT 0.41  PROT 6.9  ALBUMIN 3.2*   Coagulation profile No results found for this basename: INR, PROTIME,  in the last 168 hours  CBC:  Recent Labs Lab 06/13/13 0958  WBC 7.2  NEUTROABS 4.3  HGB 11.5*  HCT 34.4*  MCV 91.2  PLT 245   IMAGING STUDIES:  1. Chest x-ray, 2 view, from 11/18/2010 showed no active disease.  There was evidence of bilateral mastectomies and axillary lymph  node dissection.  2. Chest x-ray, 2 view, from 12/11/2011 showed no acute abnormalities  and no evidence for recurrent breast cancer.  3. Bone density scan carried out at [facility] on 04/10/2012  showed T-score of -1.9 involving the right femoral neck. There was a  statistically significant interval increase in the bone mineral density  of the lumbar spine of 13.7% compared to the exam of 09/28/2009. T-  score of the left femur was also -1.7, as it was for the right femur.  ASSESSMENT: Morgan [name] 79 y.o. female with a history of Breast cancer, unspecified laterality - Plan: CBC with Differential, Comprehensive metabolic panel  Osteopenia - Plan: CANCELED: Vitamin B12 Deficiency Panel - [facility]  Anemia, unspecified  PLAN:  1. Bilateral Breast cancers.  -- Mrs. Kwiatek continues to do well with no signs of recurrent breast cancer, now over 25 years from the time of diagnosis of her first breast cancer involving the left breast and now about 9-1/2 years from the time of diagnosis of the cancer involving the right breast. She remains disease-free and seems to be doing quite well. Arimidex was discontinued about 4 years ago, January 2011.   2. Osteopenia. --  Continue her vitamin d and calcium supplementation.  We also counseled her  on fall precautions.   3. Anemia. --Today her hemoglobin and hematocrit (11.5, 33.4 respectively) remains stable from the last visits 11.6 and 33.2.  She recently had a colonoscopy.  We will request the official report.  She remains asymptomatic.   4. Follow-up. --I have asked Mrs. Rahe to return in 1 year for a visit and we will check labs and chemistries.   All questions were answered. The patient knows to call the clinic with any problems, questions or concerns. We can certainly see the patient much sooner if necessary.  I spent 10 minutes counseling the patient face to face. The total time spent in the appointment was 15 minutes.    Jalaysia Lobb, MD 06/13/2013 11:09 AM

## 2013-06-13 NOTE — Telephone Encounter (Signed)
gv and printed appt sched and avs for pt for NOV 2015 °

## 2013-09-02 ENCOUNTER — Ambulatory Visit (INDEPENDENT_AMBULATORY_CARE_PROVIDER_SITE_OTHER): Payer: Medicare Other | Admitting: General Surgery

## 2013-09-04 ENCOUNTER — Encounter (INDEPENDENT_AMBULATORY_CARE_PROVIDER_SITE_OTHER): Payer: Self-pay | Admitting: General Surgery

## 2013-09-04 ENCOUNTER — Ambulatory Visit (INDEPENDENT_AMBULATORY_CARE_PROVIDER_SITE_OTHER): Payer: Medicare Other | Admitting: General Surgery

## 2013-09-04 VITALS — BP 148/86 | HR 72 | Temp 97.9°F | Resp 14 | Ht 62.0 in | Wt 164.2 lb

## 2013-09-04 DIAGNOSIS — C50919 Malignant neoplasm of unspecified site of unspecified female breast: Secondary | ICD-10-CM

## 2013-09-04 NOTE — Progress Notes (Signed)
Patient ID: Morgan [name], female   DOB: 02/25/1929, 80 y.o.   MRN: [id number]  History:  [name] returns for long-term followup regarding her bilateral breast cancer.  In 1992 she underwent left modified radical mastectomy for stage IIIB breast cancer. She had postop radiation therapy. In 2005 she had a right modified radical mastectomy for stage IIIc breast cancer, receptor positive. She had postop radiation therapy and in 5 years of arimadex. She is now followed by Dr. Concha Norway at the [facility] and Dr. Dorita Fray..  She says that her health has been stable and she has had a good year. All of her medications are unchanged.  She continues to work in her garden But her family has altered into stopping driving.   Exam: [name] looks good. Alert. Elderly.Hair is gray and thinning.  Neck without adenopathy no jugular venous distention  Lungs clear to auscultation bilaterally  Heart regular rate and rhythm no murmur no ectopy  Breasts bilateral mastectomy scars well healed. Tissues are soft. No nodules no ulceration. No axillary mass. 100% range of motion of both shoulders.   Assessment:   Bilateral locally advanced breast cancers. No evidence of recurrence following right modified radical mastectomy 2005 and left modified radical mastectomy in 1992.  Plan:  I talked to her about surveillance. She says that she still wants to see all 3 physicians  Because of that I will see her in one year.It is always a pleasure to see her    [name]. Dalbert Batman, [name], [facility]  General and Minimally invasive Surgery  Breast and Colorectal Surgery  Office: [phone]  Pager: [phone]

## 2013-09-04 NOTE — Patient Instructions (Signed)
Examination of your bilateral mastectomy scars, chest wall, and a lymph node areas is normal. There is no sign of cancer.  We talked about long-term surveillance.  Return to see me in one year

## 2014-06-12 ENCOUNTER — Ambulatory Visit (HOSPITAL_BASED_OUTPATIENT_CLINIC_OR_DEPARTMENT_OTHER): Payer: Medicare Other | Admitting: Hematology

## 2014-06-12 ENCOUNTER — Other Ambulatory Visit (HOSPITAL_BASED_OUTPATIENT_CLINIC_OR_DEPARTMENT_OTHER): Payer: Medicare Other

## 2014-06-12 ENCOUNTER — Encounter: Payer: Self-pay | Admitting: Hematology

## 2014-06-12 ENCOUNTER — Telehealth: Payer: Self-pay | Admitting: Hematology

## 2014-06-12 VITALS — BP 141/71 | HR 82 | Temp 97.9°F | Resp 17 | Ht 62.0 in | Wt 159.2 lb

## 2014-06-12 DIAGNOSIS — C50911 Malignant neoplasm of unspecified site of right female breast: Secondary | ICD-10-CM

## 2014-06-12 DIAGNOSIS — C50919 Malignant neoplasm of unspecified site of unspecified female breast: Secondary | ICD-10-CM

## 2014-06-12 DIAGNOSIS — Z853 Personal history of malignant neoplasm of breast: Secondary | ICD-10-CM

## 2014-06-12 DIAGNOSIS — D649 Anemia, unspecified: Secondary | ICD-10-CM

## 2014-06-12 DIAGNOSIS — M8580 Other specified disorders of bone density and structure, unspecified site: Secondary | ICD-10-CM

## 2014-06-12 DIAGNOSIS — C50912 Malignant neoplasm of unspecified site of left female breast: Principal | ICD-10-CM

## 2014-06-12 LAB — COMPREHENSIVE METABOLIC PANEL (CC13)
ALT: 16 U/L (ref 0–55)
AST: 18 U/L (ref 5–34)
Albumin: 3.4 g/dL — ABNORMAL LOW (ref 3.5–5.0)
Alkaline Phosphatase: 54 U/L (ref 40–150)
Anion Gap: 7 mEq/L (ref 3–11)
BUN: 29.4 mg/dL — ABNORMAL HIGH (ref 7.0–26.0)
CO2: 25 mEq/L (ref 22–29)
Calcium: 9.5 mg/dL (ref 8.4–10.4)
Chloride: 109 mEq/L (ref 98–109)
Creatinine: 1.2 mg/dL — ABNORMAL HIGH (ref 0.6–1.1)
Glucose: 123 mg/dl (ref 70–140)
Potassium: 4.2 mEq/L (ref 3.5–5.1)
Sodium: 141 mEq/L (ref 136–145)
Total Bilirubin: 0.38 mg/dL (ref 0.20–1.20)
Total Protein: 6.8 g/dL (ref 6.4–8.3)

## 2014-06-12 LAB — CBC WITH DIFFERENTIAL/PLATELET
BASO%: 0.6 % (ref 0.0–2.0)
Basophils Absolute: 0 10*3/uL (ref 0.0–0.1)
EOS%: 4.2 % (ref 0.0–7.0)
Eosinophils Absolute: 0.3 10*3/uL (ref 0.0–0.5)
HCT: 34.5 % — ABNORMAL LOW (ref 34.8–46.6)
HGB: 11.6 g/dL (ref 11.6–15.9)
LYMPH%: 26.4 % (ref 14.0–49.7)
MCH: 30.6 pg (ref 25.1–34.0)
MCHC: 33.6 g/dL (ref 31.5–36.0)
MCV: 91 fL (ref 79.5–101.0)
MONO#: 0.8 10*3/uL (ref 0.1–0.9)
MONO%: 11.3 % (ref 0.0–14.0)
NEUT#: 4.1 10*3/uL (ref 1.5–6.5)
NEUT%: 57.5 % (ref 38.4–76.8)
Platelets: 237 10*3/uL (ref 145–400)
RBC: 3.79 10*6/uL (ref 3.70–5.45)
RDW: 14.2 % (ref 11.2–14.5)
WBC: 7.1 10*3/uL (ref 3.9–10.3)
lymph#: 1.9 10*3/uL (ref 0.9–3.3)

## 2014-06-12 NOTE — Progress Notes (Signed)
[facility] ONCOLOGY OFFICE PROGRESS NOTE DATE OF VISIT: 06/12/2014  Morgan Sheller, Morgan [name] [address], [place] [address]  DIAGNOSIS: Bilateral breast cancer - Plan: CBC with Differential, Comprehensive metabolic panel (Cmet) - [facility], DG Chest 2 View  Chief Complaint  Patient presents with  . Follow-up    CURRENT THERAPY: Observation.    INTERVAL HISTORY:  Morgan [name] 80 y.o. female with a history of bilateral breast cancers, both of which were locally advanced, currently without evidence of disease and on no therapy is here for follow-up. SHE CELEBRATES HER [birth year]  BIRTHDAY TODAY. She underwent her colonoscopy with Dr. Cristina Gong in 2013 and was reported to be within normal limits.  She denies any change in her clinical status. She was last seen by Dr  Juliann Mule on 06/13/2013. She remains very active working in her tiny garden and tending to her flowers.  She lives her daughter and son-in-law.  Her weight is stable.  She denies any recent hospitalizations or emergency room visits.  She denies any fevers or chills.  She denies any evidence of blood in her stools or melena. No symptoms to suggest recurrent breast cancer.   MEDICAL HISTORY: Past Medical History  Diagnosis Date  . Hearing loss   . Cancer     breast bilateral  . Hyperlipidemia   . Hypertension   . Diabetes mellitus without complication   . Thyroid disease     hypothyroidism    INTERIM HISTORY: has Breast cancer; Osteopenia; and Anemia, unspecified on her problem list.    ALLERGIES:  has No Known Allergies.  MEDICATIONS: has a current medication list which includes the following prescription(s): calcium-vitamin d, clopidogrel, levothyroxine, lisinopril, metformin, metoprolol succinate, multivitamin, and simvastatin.  SURGICAL HISTORY:  Past Surgical History  Procedure Laterality Date  . Mastectomy  1989/2005  . Cholecystectomy  1978?  Marland Kitchen Squamous cell carcinoma excision     PROBLEM  LIST:  1. Carcinoma of the right breast diagnosed in June 2005, stage IIIC with 15/19 positive lymph nodes. Estrogen and progesterone receptors were strongly positive. HER-2/neu was negative by FISH.  The patient underwent a right modified radical mastectomy on 02/04/2004. She received 5 cycles of adjuvant chemotherapy with epirubicin, Cytoxan and Taxotere. Treatments were initiated on  03/15/2004 concluded May 11, 2004. The patient then received radiation treatments from June 13, 2004 through July 28, 2004. She was then on adjuvant Arimidex from August 15, 2004  through January 2011 and has remained disease free.  2. Left breast carcinoma dating back to August 1989, stage IIIB status post left modified radical mastectomy on 03/31/1988. There was dermal lymphatic invasion pathologically. Also peau d'orange. 4/11 lymph nodes were positive. Estrogen receptor was 29, progesterone receptor 43. The patient received adjuvant chemotherapy with CAF for 6 cycles from September 1989 through January 1990. Cumulative Adriamycin was 670 mg equal to 335 mg per m squared. The patient then received radiation treatments 5040 cGy plus a 540 cGy boost from August 28, 1988 through October 09, 1988.  3. History of lymphedema of the right upper extremity in June 2007 treated at the [facility].  4. Diabetes mellitus.  5. Hypertension.  6. Hypothyroidism.  7. Dyslipidemia.  8. Obesity.  9. Osteopenia.  10. Mild anemia noted on 06/13/2012.  11. 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 Danella Sensing  on 05/28/2012.    REVIEW OF SYSTEMS:   Constitutional:  Denies fevers, chills or abnormal weight loss Eyes: Denies blurriness of vision Ears, nose, mouth, throat, and face: Denies mucositis or sore throat Respiratory: Denies cough, dyspnea or wheezes Cardiovascular: Denies palpitation, chest discomfort or lower extremity  swelling Gastrointestinal:  Denies nausea, heartburn or change in bowel habits Skin: Denies abnormal skin rashes Lymphatics: Denies new lymphadenopathy or easy bruising Neurological:Denies numbness, tingling or new weaknesses Behavioral/Psych: Mood is stable, no new changes  All other systems were reviewed with the patient and are negative.  PHYSICAL EXAMINATION: ECOG PERFORMANCE STATUS: 0-1  Blood pressure 141/71, pulse 82, temperature 97.9 F (36.6 C), temperature source Oral, resp. rate 17, height _0  (1.575 m), weight 159 lb 3.2 oz (72.213 kg), SpO2 98 %.  GENERAL:alert, no distress and comfortable; elderly female.  Cautions gait.  SKIN: skin color, texture, turgor are normal, no rashes or significant lesions EYES: normal, Conjunctiva are pink and non-injected, sclera clear OROPHARYNX:no exudate, no erythema and lips, buccal mucosa, and tongue normal  NECK: supple, thyroid normal size, non-tender, without nodularity LYMPH:  no palpable lymphadenopathy in the cervical, axillary or supraclavicular LUNGS: clear to auscultation and percussion with normal breathing effort HEART: regular rate & rhythm and no murmurs and no lower extremity edema BREAST: Both her right and left  chest wall reveals scars well healed without evidence of recurrence within the chest wall and without lymphadenopathy.  ABDOMEN:abdomen soft, non-tender and normal bowel sounds Musculoskeletal:no cyanosis of digits and no clubbing  NEURO: alert & oriented x 3 with fluent speech, no focal motor/sensory deficits   LABORATORY DATA: Results for orders placed or performed in visit on 06/12/14 (from the past 48 hour(s))  CBC with Differential     Status: Abnormal   Collection Time: 06/12/14 10:26 AM  Result Value Ref Range   WBC 7.1 3.9 - 10.3 10e3/uL   NEUT# 4.1 1.5 - 6.5 10e3/uL   HGB 11.6 11.6 - 15.9 g/dL   HCT 34.5 (L) 34.8 - 46.6 %   Platelets 237 145 - 400 10e3/uL   MCV 91.0 79.5 - 101.0 fL   MCH 30.6  25.1 - 34.0 pg   MCHC 33.6 31.5 - 36.0 g/dL   RBC 3.79 3.70 - 5.45 10e6/uL   RDW 14.2 11.2 - 14.5 %   lymph# 1.9 0.9 - 3.3 10e3/uL   MONO# 0.8 0.1 - 0.9 10e3/uL   Eosinophils Absolute 0.3 0.0 - 0.5 10e3/uL   Basophils Absolute 0.0 0.0 - 0.1 10e3/uL   NEUT% 57.5 38.4 - 76.8 %   LYMPH% 26.4 14.0 - 49.7 %   MONO% 11.3 0.0 - 14.0 %   EOS% 4.2 0.0 - 7.0 %   BASO% 0.6 0.0 - 2.0 %    Labs:  Lab Results  Component Value Date   WBC 7.1 06/12/2014   HGB 11.6 06/12/2014   HCT 34.5* 06/12/2014   MCV 91.0 06/12/2014   PLT 237 06/12/2014   NEUTROABS 4.1 06/12/2014      Chemistry      Component Value Date/Time   NA 144 06/13/2013 0958   NA 140 12/11/2011 1510   K 4.4 06/13/2013 0958   K 4.2 12/11/2011 1510   CL 110* 06/13/2012 1008   CL 106 12/11/2011 1510   CO2 25 06/13/2013 0958   CO2 24 12/11/2011 1510   BUN 20.3 06/13/2013 0958   BUN 20 12/11/2011 1510   CREATININE 1.1 06/13/2013 0958   CREATININE 1.02 12/11/2011 1510      Component Value Date/Time   CALCIUM  9.5 06/13/2013 0958   CALCIUM 9.3 12/11/2011 1510   ALKPHOS 60 06/13/2013 0958   ALKPHOS 61 12/11/2011 1510   AST 17 06/13/2013 0958   AST 19 12/11/2011 1510   ALT 10 06/13/2013 0958   ALT 15 12/11/2011 1510   BILITOT 0.41 06/13/2013 0958   BILITOT 0.3 12/11/2011 1510     Basic Metabolic Panel: No results for input(s): NA, K, CL, CO2, GLUCOSE, BUN, CREATININE, CALCIUM, MG, PHOS in the last 168 hours. GFR Estimated Creatinine Clearance: 36 mL/min (by C-G formula based on Cr of 1.1). Liver Function Tests: No results for input(s): AST, ALT, ALKPHOS, BILITOT, PROT, ALBUMIN in the last 168 hours. Coagulation profile No results for input(s): INR, PROTIME in the last 168 hours.  CBC:  Recent Labs Lab 06/12/14 1026  WBC 7.1  NEUTROABS 4.1  HGB 11.6  HCT 34.5*  MCV 91.0  PLT 237   IMAGING STUDIES:  1. Chest x-ray, 2 view, from 11/18/2010 showed no active disease.  There was evidence of bilateral  mastectomies and axillary lymph  node dissection.  2. Chest x-ray, 2 view, from 12/11/2011 showed no acute abnormalities  and no evidence for recurrent breast cancer.  3. Bone density scan carried out at [facility] on 04/10/2012  showed T-score of -1.9 involving the right femoral neck. There was a  statistically significant interval increase in the bone mineral density  of the lumbar spine of 13.7% compared to the exam of 09/28/2009. T-  score of the left femur was also -1.7, as it was for the right femur.  ASSESSMENT: Morgan [name] 80 y.o. female with a history of Bilateral breast cancer - Plan: CBC with Differential, Comprehensive metabolic panel (Cmet) - [facility], DG Chest 2 View  PLAN:  1. Bilateral Breast cancers.  -- Morgan [name] continues to do well with no signs of recurrent breast cancer, now over 26 years from the time of diagnosis of her first breast cancer involving the left breast and now about 10-1/2 years from the time of diagnosis of the cancer involving the right breast. She remains disease-free and seems to be doing quite well. Arimidex was discontinued about 5 years ago, January 2011.   2. Osteopenia. -- Continue her vitamin d and calcium supplementation.  We also counseled her on fall precautions.   3. Anemia. --Today her hemoglobin and hematocrit (11.6, 34.5 respectively) remains stable from the last visits.  She had a colonoscopy in 2013. She remains asymptomatic.   4. Follow-up. --I have asked Morgan [name] to return in 1 year for a visit and we will check labs and chemistries and do a Chest Xray.   All questions were answered. The patient knows to call the clinic with any problems, questions or concerns. We can certainly see the patient much sooner if necessary. I wished her a very happy Birthday.  I spent 15 minutes counseling the patient face to face. The total time spent in the appointment was 20 minutes.    Bernadene Bell, Morgan [name] Medical  Hematologist/Oncologist [place] Pager: [phone] Office No: [phone]

## 2014-06-12 NOTE — Telephone Encounter (Signed)
gv adn printeda ppt sched and avs for pt for NOV 2016 °

## 2014-11-06 ENCOUNTER — Telehealth: Payer: Self-pay | Admitting: Hematology

## 2014-11-06 NOTE — Telephone Encounter (Signed)
Due to GI Clinic moved 11/18 appointment with YF to 1pm. Spoke with patient she is aware. Also confirmed 11/11 lab and xray.

## 2015-06-17 ENCOUNTER — Other Ambulatory Visit: Payer: Self-pay | Admitting: *Deleted

## 2015-06-17 DIAGNOSIS — C50919 Malignant neoplasm of unspecified site of unspecified female breast: Secondary | ICD-10-CM

## 2015-06-18 ENCOUNTER — Ambulatory Visit (HOSPITAL_COMMUNITY)
Admission: RE | Admit: 2015-06-18 | Discharge: 2015-06-18 | Disposition: A | Discharge status: Home / Self Care | Payer: Medicare Other | Source: Ambulatory Visit | Attending: Hematology | Admitting: Hematology

## 2015-06-18 ENCOUNTER — Other Ambulatory Visit (HOSPITAL_BASED_OUTPATIENT_CLINIC_OR_DEPARTMENT_OTHER): Payer: Medicare Other

## 2015-06-18 DIAGNOSIS — C50911 Malignant neoplasm of unspecified site of right female breast: Secondary | ICD-10-CM | POA: Diagnosis not present

## 2015-06-18 DIAGNOSIS — Z9013 Acquired absence of bilateral breasts and nipples: Secondary | ICD-10-CM | POA: Insufficient documentation

## 2015-06-18 DIAGNOSIS — C50919 Malignant neoplasm of unspecified site of unspecified female breast: Secondary | ICD-10-CM | POA: Diagnosis not present

## 2015-06-18 DIAGNOSIS — C50912 Malignant neoplasm of unspecified site of left female breast: Secondary | ICD-10-CM | POA: Insufficient documentation

## 2015-06-18 LAB — CBC WITH DIFFERENTIAL/PLATELET
BASO%: 0.6 % (ref 0.0–2.0)
Basophils Absolute: 0.1 10*3/uL (ref 0.0–0.1)
EOS%: 3.2 % (ref 0.0–7.0)
Eosinophils Absolute: 0.3 10*3/uL (ref 0.0–0.5)
HCT: 33.7 % — ABNORMAL LOW (ref 34.8–46.6)
HGB: 11.5 g/dL — ABNORMAL LOW (ref 11.6–15.9)
LYMPH%: 28.8 % (ref 14.0–49.7)
MCH: 31.1 pg (ref 25.1–34.0)
MCHC: 34.1 g/dL (ref 31.5–36.0)
MCV: 91.1 fL (ref 79.5–101.0)
MONO#: 0.8 10*3/uL (ref 0.1–0.9)
MONO%: 10.2 % (ref 0.0–14.0)
NEUT#: 4.7 10*3/uL (ref 1.5–6.5)
NEUT%: 57.2 % (ref 38.4–76.8)
Platelets: 207 10*3/uL (ref 145–400)
RBC: 3.7 10*6/uL (ref 3.70–5.45)
RDW: 14 % (ref 11.2–14.5)
WBC: 8.2 10*3/uL (ref 3.9–10.3)
lymph#: 2.4 10*3/uL (ref 0.9–3.3)

## 2015-06-18 LAB — COMPREHENSIVE METABOLIC PANEL (CC13)
ALT: 14 U/L (ref 0–55)
AST: 19 U/L (ref 5–34)
Albumin: 3.4 g/dL — ABNORMAL LOW (ref 3.5–5.0)
Alkaline Phosphatase: 58 U/L (ref 40–150)
Anion Gap: 8 mEq/L (ref 3–11)
BUN: 18.7 mg/dL (ref 7.0–26.0)
CO2: 25 mEq/L (ref 22–29)
Calcium: 9.4 mg/dL (ref 8.4–10.4)
Chloride: 111 mEq/L — ABNORMAL HIGH (ref 98–109)
Creatinine: 1.1 mg/dL (ref 0.6–1.1)
EGFR: 47 mL/min/{1.73_m2} — ABNORMAL LOW (ref >90)
Glucose: 79 mg/dl (ref 70–140)
Potassium: 4.4 mEq/L (ref 3.5–5.1)
Sodium: 144 mEq/L (ref 136–145)
Total Bilirubin: 0.4 mg/dL (ref 0.20–1.20)
Total Protein: 6.8 g/dL (ref 6.4–8.3)

## 2015-06-25 ENCOUNTER — Telehealth: Payer: Self-pay | Admitting: Hematology

## 2015-06-25 ENCOUNTER — Ambulatory Visit (HOSPITAL_BASED_OUTPATIENT_CLINIC_OR_DEPARTMENT_OTHER): Payer: Medicare Other | Admitting: Hematology

## 2015-06-25 ENCOUNTER — Encounter: Payer: Self-pay | Admitting: Hematology

## 2015-06-25 ENCOUNTER — Ambulatory Visit: Payer: Medicare Other

## 2015-06-25 VITALS — BP 184/71 | HR 83 | Temp 97.7°F | Resp 18 | Ht 62.0 in | Wt 148.9 lb

## 2015-06-25 DIAGNOSIS — C50919 Malignant neoplasm of unspecified site of unspecified female breast: Secondary | ICD-10-CM

## 2015-06-25 NOTE — Progress Notes (Signed)
Morgan [name], Morgan [name], Morgan [name], Morgan [name], Morgan site of [name] ([place])  No chief complaint on file.  PROBLEM LIST:  1. Carcinoma of the right [name] diagnosed in June 2005, stage IIIC with 15/19 positive lymph nodes. Estrogen and progesterone receptors were strongly positive. HER-2/neu was negative by FISH.  The patient underwent a right modified radical mastectomy on 02/04/2004. She received 5 cycles of adjuvant chemotherapy with epirubicin, Cytoxan and Taxotere. Treatments were initiated on 03/15/2004 concluded May 11, 2004. The patient then received radiation treatments from June 13, 2004 through July 28, 2004. She was then on adjuvant Arimidex from August 15, 2004 through January 2011 and has remained disease free.  2. Left [name] carcinoma dating back to August 1989, stage IIIB status post left modified radical mastectomy on 03/31/1988. There was dermal lymphatic invasion pathologically. Also peau d'orange. 4/11 lymph nodes were positive. Estrogen receptor was 29, progesterone receptor 43. The patient received adjuvant chemotherapy with CAF for 6 cycles from September 1989 through January 1990. Cumulative Adriamycin was 670 mg equal to 335 mg per m squared. The patient then received radiation treatments 5040 cGy plus a 540 cGy boost from August 28, 1988 through October 09, 1988.  3. History of lymphedema of the right upper extremity in June 2007 treated at the [facility].  4. Diabetes mellitus.  5. Hypertension.  6. Hypothyroidism.  7. Dyslipidemia.  8. Obesity.  9. Osteopenia.  10. Mild anemia noted on 06/13/2012.  11. 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 Danella Sensing  on 05/28/2012.   CURRENT  THERAPY: Observation.    INTERVAL HISTORY:  Morgan [name] 81 y.o. female with a history of bilateral [name] cancers, is here for follow-up. She was last seen by Dr. Lona Kettle one year ago, and this is my first encounter with her. She is doing very well, denies any significant pain, dyspnea, or other symptoms. She functions very well at home.    MEDICAL HISTORY: Past Medical History  Diagnosis Date  . Hearing loss   . Cancer ([place])     [name] bilateral  . Hyperlipidemia   . Hypertension   . Diabetes mellitus without complication ([place])   . Thyroid disease     hypothyroidism    INTERIM HISTORY: has [name] cancer ([place]); Osteopenia; and Anemia, Morgan on her problem list.    ALLERGIES:  has No Known Allergies.  MEDICATIONS: has a current medication list which includes the following prescription(s): calcium-vitamin d, clopidogrel, levothyroxine, lisinopril, metformin, metoprolol succinate, multivitamin, and simvastatin.  SURGICAL HISTORY:  Past Surgical History  Procedure [name] Date  . Mastectomy  1989/2005  . Cholecystectomy  1978?  Marland Kitchen Squamous cell carcinoma excision      REVIEW OF SYSTEMS:   Constitutional: Denies fevers, chills or abnormal weight loss Eyes: Denies blurriness of vision Ears, nose, mouth, throat, and face: Denies mucositis or sore throat Respiratory: Denies cough, dyspnea or wheezes Cardiovascular: Denies palpitation, chest discomfort or lower extremity swelling Gastrointestinal:  Denies nausea, heartburn or change in bowel habits Skin: Denies abnormal skin rashes Lymphatics: Denies new lymphadenopathy or easy bruising Neurological:Denies numbness, tingling or new weaknesses Behavioral/Psych: Mood is stable, no new changes  All other systems were reviewed with the patient and are negative.  PHYSICAL EXAMINATION: ECOG PERFORMANCE STATUS: 0  Blood  pressure 184/71, pulse 83, temperature 97.7 F (36.5 C), temperature source Oral, resp. rate 18,  height _0  (1.575 m), weight 148 lb 14.4 oz (67.541 kg), SpO2 97 %.  GENERAL:alert, no distress and comfortable; elderly female.  Cautions gait.  SKIN: skin color, texture, turgor are normal, no rashes or significant lesions EYES: normal, Conjunctiva are pink and non-injected, sclera clear OROPHARYNX:no exudate, no erythema and lips, buccal mucosa, and tongue normal  NECK: supple, thyroid normal size, non-tender, without nodularity LYMPH:  no palpable lymphadenopathy in the cervical, axillary or supraclavicular LUNGS: clear to auscultation and percussion with normal breathing effort HEART: regular rate & rhythm and no murmurs and no lower extremity edema [name]: s/p bilateral mastectomy. Both her right and left  chest wall reveals scars well healed without evidence of recurrence within the chest wall and without lymphadenopathy.  ABDOMEN:abdomen soft, non-tender and normal bowel sounds Musculoskeletal:no cyanosis of digits and no clubbing  NEURO: alert & oriented x 3 with fluent speech, no focal motor/sensory deficits   LABORATORY DATA: CBC Latest Ref Rng 06/18/2015 06/12/2014 06/13/2013  WBC 3.9 - 10.3 10e3/uL 8.2 7.1 7.2  Hemoglobin 11.6 - 15.9 g/dL 11.5(L) 11.6 11.5(L)  Hematocrit 34.8 - 46.6 % 33.7(L) 34.5(L) 34.4(L)  Platelets 145 - 400 10e3/uL 207 237 245    CMP Latest Ref Rng 06/18/2015 06/12/2014 06/13/2013  Glucose 70 - 140 mg/dl 79 123 122  BUN 7.0 - 26.0 mg/dL 18.7 29.4(H) 20.3  Creatinine 0.6 - 1.1 mg/dL 1.1 1.2(H) 1.1  Sodium 136 - 145 mEq/L 144 141 144  Potassium 3.5 - 5.1 mEq/L 4.4 4.2 4.4  Chloride 98 - 107 mEq/L - - -  CO2 22 - 29 mEq/L _1 Calcium 8.4 - 10.4 mg/dL 9.4 9.5 9.5  Total Protein 6.4 - 8.3 g/dL 6.8 6.8 6.9  Total Bilirubin 0.20 - 1.20 mg/dL 0.40 0.38 0.41  Alkaline Phos 40 - 150 U/L 58 54 60  AST 5 - 34 U/L _2 ALT 0 - 55 U/L _3 IMAGING STUDIES:  1. Chest x-ray, 2 view, from 06/18/2015  IMPRESSION: 1. Mild hyperinflation  slightly more conspicuous than on the previous study. There is no evidence of pneumonia, CHF, nor other acute cardiopulmonary abnormality. There are no findings suspicious for metastatic disease. 2. Previous bilateral mastectomy and axillary lymph node dissections. 3. Stable moderate dextrocurvature of the mid to lower thoracic Spine.  2. Bone density scan carried out at [facility] on 04/10/2012  showed T-score of -1.9 involving the right femoral neck. There was a  statistically significant interval increase in the bone mineral density  of the lumbar spine of 13.7% compared to the exam of 09/28/2009. T-  score of the left femur was also -1.7, as it was for the right femur.  ASSESSMENT: ALEECE LOYD 81 y.o. female with a history of Malignant neoplasm of female [name], Morgan [name], Morgan site of [name] ([place])  PLAN:  1. Bilateral [name] cancers.  -- Mrs. Eiland continues to do well with no signs of recurrent [name] cancer, now over 27 years from the time of diagnosis of her first [name] cancer involving the left [name] and now about 11 years from the time of diagnosis of the cancer involving the right [name]. -Her physical exam reviewed no evidence of local recurrence. -She is clinically doing very well, lab reviewed, mild anemia stable, no other lab abnormalities. -She prefers to continue surveillance with [place]. I'll continue lab  and physical exam, I do not think a chest x-ray is necessary.  2. Osteopenia. -- Continue her vitamin d and calcium supplementation.  We also counseled her on fall precautions.   3. Anemia. -She has a mild anemia, very stable over years. Hemoglobin 11.5, possibly related to her age and previous chemotherapy.  -Her previous ferritin and iron studies were normal.  4. Follow-up. --I have asked Mrs. Dault to return in 1 year for a visit and we will check labs do a physical exam.   All questions were answered. The patient knows to call  the clinic with any problems, questions or concerns. We can certainly see the patient much sooner if necessary. I wished her a very happy Birthday.  I spent 15 minutes counseling the patient face to face. The total time spent in the appointment was 20 minutes.  Truitt Merle MD  06/25/2015

## 2015-06-25 NOTE — Telephone Encounter (Signed)
Gave patient avs report and appointments for November 2017.  °

## 2015-10-29 ENCOUNTER — Other Ambulatory Visit: Payer: Self-pay | Admitting: General Surgery

## 2015-10-29 DIAGNOSIS — R1319 Other dysphagia: Secondary | ICD-10-CM

## 2015-10-29 DIAGNOSIS — R1310 Dysphagia, unspecified: Secondary | ICD-10-CM

## 2015-11-09 ENCOUNTER — Ambulatory Visit
Admission: RE | Admit: 2015-11-09 | Discharge: 2015-11-09 | Disposition: A | Discharge status: Home / Self Care | Payer: Medicare Other | Source: Ambulatory Visit | Attending: General Surgery | Admitting: General Surgery

## 2015-11-09 DIAGNOSIS — R1310 Dysphagia, unspecified: Secondary | ICD-10-CM

## 2015-11-09 DIAGNOSIS — R1319 Other dysphagia: Secondary | ICD-10-CM

## 2015-12-06 DIAGNOSIS — R1314 Dysphagia, pharyngoesophageal phase: Secondary | ICD-10-CM | POA: Insufficient documentation

## 2016-02-16 ENCOUNTER — Ambulatory Visit: Payer: Self-pay | Admitting: Otolaryngology

## 2016-02-16 NOTE — H&P (Signed)
Otolaryngology Office Note  HPI:   Morgan [name] is a 82 y.o. female who presents as a consult patient with a chief complaint of Difficulty swallowing. She has had progressive difficulty swallowing for about a year. She had a barium swallow recently which revealed a Zenker diverticulum. She was referred here for possible treatment. She has a history of significant caffeine consumption up until a few years ago. She now lives with her daughter and they do not drink any caffeine. She never smoked or drank alcohol. She has lost about 12-14 pounds over the past 8 months or so. She has had to modify her diet and has been eating a lot of soup.   PMH/Meds/All/SocHx/FamHx/ROS:    Past Medical History       Past Medical History:  Diagnosis Date  . Arthritis   . Breast CA ([place])   . Diabetes mellitus ([place])   . Hearing loss   . Hypertension   . Hypothyroid   . Osteopathia   . TIA (transient ischemic attack)        Past Surgical History        Past Surgical History:  Procedure Laterality Date  . CATARACT EXTRACTION    . CHOLECYSTECTOMY    . MASTECTOMY        No family history of bleeding disorders, wound healing problems or difficulty with anesthesia.    Social History   Social History        Social History  . Marital status: Widowed    Spouse name: N/A  . Number of children: N/A  . Years of education: N/A      Occupational History  . Not on file.       Social History Main Topics  . Smoking status: Never Smoker  . Smokeless tobacco: Never Used  . Alcohol use No  . Drug use: No  . Sexual activity: Not on file       Other Topics Concern  . Not on file      Social History Narrative  . No narrative on file       Current Outpatient Prescriptions:  .  losartan (COZAAR) 50 MG tablet, Take 50 mg by mouth daily., Disp: , Rfl:  .  calcium carbonate-vitamin D3 600 mg(1,500mg ) -400 unit Tab, Take by mouth., Disp: , Rfl:  .   clopidogrel (PLAVIX) 75 mg tablet  *ANTIPLATELET*, Take by mouth., Disp: , Rfl:  .  levothyroxine (SYNTHROID, LEVOTHROID) 88 MCG tablet, Take 88 mcg by mouth., Disp: , Rfl:  .  metoPROLOL succinate (TOPROL-XL) 25 MG 24 hr tablet, Take by mouth., Disp: , Rfl:  .  multivitamin (MULTIVITAMIN) per tablet, Take 1 tablet by mouth., Disp: , Rfl:  .  simvastatin (ZOCOR) 10 MG tablet, Take by mouth., Disp: , Rfl:   A complete ROS was performed with pertinent positives/negatives noted in the HPI. The remainder of the ROS are negative.   Physical Exam:    BP 138/47  Ht 1.575 m (5\' 2" )  Wt 58.5 kg (129 lb)  BMI 23.59 kg/m2   General:  Healthy and alert, in no distress, breathing easily. Normal affect. In a pleasant mood. Head: Normocephalic, atraumatic. No masses, or scars. Eyes: Pupils are equal, and reactive to light. Vision is grossly intact. No spontaneous or gaze nystagmus. Ears: Ear canals are clear. Tympanic membranes are intact, with normal landmarks and the middle ears are clear and healthy. Hearing: Grossly normal. Nose. Nasal cavities are clear with healthy mucosa, no polyps or exudate.Airways  are patent. Face: No masses or scars, facial nerve function is symmetric. Oral Cavity: No mucosal abnormalities are noted. Tongue with normal mobility. Dentition appears healthy. Oropharynx: Tonsils are symmetric. There are no mucosal masses identified. Tongue base appears normal and healthy. Larynx/Hypopharynx: indirect exam reveals healthy, mobile vocal cords, without mucosal lesions in the hypopharynx or larynx. Neck: No palpable masses, no cervical adenopathy, no thyroid nodules or enlargement. Neuro: Cranial nerves II-XII will normal function. Balance: Normal gate. Other findings: none.   Independent Review of Additional Tests or Records:  none  Procedures:  none   Impression & Plans:  Morgan [name] is a 82 y.o. female with Zenker diverticulum, otherwise normal head and  neck exam. We discussed the possible role of chronic reflux and causing Zenker diverticulum. She is currently without dietary risk factors for reflux. She has lost some weight and has had to modify her diet so surgical intervention is appropriate. We discussed endoscopic diverticulectomy. We discussed risks and benefits. We discussed overnight stay in the hospital. We will have to stop her Plavix preop. She is going to change primary care physician so she wants to wait until after that has been established. They will contact us with questions or when they are ready to schedule.

## 2016-02-17 NOTE — Pre-Procedure Instructions (Signed)
    Morgan [name]  02/17/2016      [facility] - [address], [address] AT [facility] [address] [place] [address] Phone: [phone] Fax: [phone]    Your procedure is scheduled on Friday, February 25, 2016   Report to [facility] Admitting at 9:00 A.M.   Call this number if you have problems the morning of surgery:  [phone]   Remember:  Do not eat food or drink liquids after midnight Thursday, February 24, 2016   Take these medicines the morning of surgery with A SIP OF WATER: levothyroxine (SYNTHROID, LEVOTHROID), metoprolol              STOP ASPIRIN,ANTIINFLAMATORIES (IBUPROFEN,ALEVE,MOTRIN,ADVIL,GOODY'S POWDERS),HERBAL SUPPLEMENTS,FISH OIL,AND VITAMINS 5-7 DAYS PRIOR TO SURGERY        Do not wear jewelry, make-up or nail polish.  Do not wear lotions, powders, or perfumes.  You may not wear deoderant.  Do not shave 48 hours prior to surgery.    Do not bring valuables to the hospital.  [facility] is not responsible for any belongings or valuables.  Contacts, dentures or bridgework may not be worn into surgery.  Leave your suitcase in the car.  After surgery it may be brought to your room.  For patients admitted to the hospital, discharge time will be determined by your treatment team.  Patients discharged the day of surgery will not be allowed to drive home.    Special instructions: Shower the night before surgery and the morning of surgery with CHG.  Please read over the following fact sheets that you were given. Pain Booklet, Coughing and Deep Breathing and Surgical Site Infection Prevention

## 2016-02-18 ENCOUNTER — Encounter (HOSPITAL_COMMUNITY)
Admission: RE | Admit: 2016-02-18 | Discharge: 2016-02-18 | Disposition: A | Discharge status: Home / Self Care | Payer: Medicare Other | Source: Ambulatory Visit | Attending: Otolaryngology | Admitting: Otolaryngology

## 2016-02-18 ENCOUNTER — Encounter (HOSPITAL_COMMUNITY): Payer: Self-pay

## 2016-02-18 DIAGNOSIS — Z01812 Encounter for preprocedural laboratory examination: Secondary | ICD-10-CM | POA: Diagnosis not present

## 2016-02-18 DIAGNOSIS — Z01810 Encounter for preprocedural cardiovascular examination: Secondary | ICD-10-CM | POA: Diagnosis present

## 2016-02-18 DIAGNOSIS — K225 Diverticulum of esophagus, acquired: Secondary | ICD-10-CM | POA: Insufficient documentation

## 2016-02-18 HISTORY — DX: Hypothyroidism, unspecified: E03.9

## 2016-02-18 HISTORY — DX: Unspecified osteoarthritis, unspecified site: M19.90

## 2016-02-18 HISTORY — DX: Adverse effect of antineoplastic and immunosuppressive drugs, initial encounter: T45.1X5A

## 2016-02-18 HISTORY — DX: Cardiac arrhythmia, unspecified: I49.9

## 2016-02-18 HISTORY — DX: Cerebral infarction, unspecified: I63.9

## 2016-02-18 HISTORY — DX: Personal history of irradiation: Z92.3

## 2016-02-18 LAB — CBC
HCT: 36.4 % (ref 36.0–46.0)
Hemoglobin: 11.8 g/dL — ABNORMAL LOW (ref 12.0–15.0)
MCH: 29.6 pg (ref 26.0–34.0)
MCHC: 32.4 g/dL (ref 30.0–36.0)
MCV: 91.5 fL (ref 78.0–100.0)
Platelets: 227 10*3/uL (ref 150–400)
RBC: 3.98 MIL/uL (ref 3.87–5.11)
RDW: 14.3 % (ref 11.5–15.5)
WBC: 8 10*3/uL (ref 4.0–10.5)

## 2016-02-18 LAB — BASIC METABOLIC PANEL
Anion gap: 7 (ref 5–15)
BUN: 17 mg/dL (ref 6–20)
CO2: 28 mmol/L (ref 22–32)
Calcium: 9.4 mg/dL (ref 8.9–10.3)
Chloride: 107 mmol/L (ref 101–111)
Creatinine, Ser: 1.21 mg/dL — ABNORMAL HIGH (ref 0.44–1.00)
GFR calc Af Amer: 46 mL/min — ABNORMAL LOW (ref >60)
GFR calc non Af Amer: 40 mL/min — ABNORMAL LOW (ref >60)
Glucose, Bld: 127 mg/dL — ABNORMAL HIGH (ref 65–99)
Potassium: 3.8 mmol/L (ref 3.5–5.1)
Sodium: 142 mmol/L (ref 135–145)

## 2016-02-18 LAB — GLUCOSE, CAPILLARY: Glucose-Capillary: 151 mg/dL — ABNORMAL HIGH (ref 65–99)

## 2016-02-19 LAB — HEMOGLOBIN A1C
Hgb A1c MFr Bld: 5.6 % (ref 4.8–5.6)
Mean Plasma Glucose: 114 mg/dL

## 2016-02-25 ENCOUNTER — Ambulatory Visit (HOSPITAL_COMMUNITY): Payer: Medicare Other | Admitting: Certified Registered Nurse Anesthetist

## 2016-02-25 ENCOUNTER — Observation Stay (HOSPITAL_COMMUNITY)
Admission: RE | Admit: 2016-02-25 | Discharge: 2016-02-26 | Disposition: A | Discharge status: Home / Self Care | Payer: Medicare Other | Source: Ambulatory Visit | Attending: Otolaryngology | Admitting: Otolaryngology

## 2016-02-25 ENCOUNTER — Encounter (HOSPITAL_COMMUNITY)
Admission: RE | Disposition: A | Discharge status: Home / Self Care | Payer: Self-pay | Source: Ambulatory Visit | Attending: Otolaryngology

## 2016-02-25 ENCOUNTER — Encounter (HOSPITAL_COMMUNITY): Payer: Self-pay | Admitting: Surgery

## 2016-02-25 DIAGNOSIS — E119 Type 2 diabetes mellitus without complications: Secondary | ICD-10-CM | POA: Diagnosis not present

## 2016-02-25 DIAGNOSIS — K225 Diverticulum of esophagus, acquired: Secondary | ICD-10-CM | POA: Diagnosis present

## 2016-02-25 DIAGNOSIS — I10 Essential (primary) hypertension: Secondary | ICD-10-CM | POA: Insufficient documentation

## 2016-02-25 DIAGNOSIS — Z8673 Personal history of transient ischemic attack (TIA), and cerebral infarction without residual deficits: Secondary | ICD-10-CM | POA: Diagnosis not present

## 2016-02-25 DIAGNOSIS — Z7902 Long term (current) use of antithrombotics/antiplatelets: Secondary | ICD-10-CM | POA: Diagnosis not present

## 2016-02-25 DIAGNOSIS — Z853 Personal history of malignant neoplasm of breast: Secondary | ICD-10-CM | POA: Diagnosis not present

## 2016-02-25 DIAGNOSIS — E039 Hypothyroidism, unspecified: Secondary | ICD-10-CM | POA: Insufficient documentation

## 2016-02-25 HISTORY — PX: ZENKER'S DIVERTICULECTOMY ENDOSCOPIC: SHX6191

## 2016-02-25 HISTORY — PX: ZENKER'S DIVERTICULECTOMY ENDOSCOPIC: SHX5224

## 2016-02-25 LAB — GLUCOSE, CAPILLARY
Glucose-Capillary: 84 mg/dL (ref 65–99)
Glucose-Capillary: 111 mg/dL — ABNORMAL HIGH (ref 65–99)

## 2016-02-25 SURGERY — ZENKER'S DIVERTICULECTOMY ENDOSCOPIC
Anesthesia: General

## 2016-02-25 MED ORDER — ROCURONIUM BROMIDE 50 MG/5ML IV SOLN
INTRAVENOUS | Status: AC
  Filled 2016-02-25: qty 1

## 2016-02-25 MED ORDER — ONDANSETRON HCL 4 MG/2ML IJ SOLN
INTRAMUSCULAR | Status: AC
  Filled 2016-02-25: qty 4

## 2016-02-25 MED ORDER — DEXTROSE-NACL 5-0.9 % IV SOLN
INTRAVENOUS | Status: DC
  Administered 2016-02-25 – 2016-02-26 (×2): via INTRAVENOUS

## 2016-02-25 MED ORDER — PROMETHAZINE HCL 25 MG PO TABS: 25.0000 mg | ORAL_TABLET | Freq: Four times a day (QID) | ORAL | Status: DC | PRN

## 2016-02-25 MED ORDER — LACTATED RINGERS IV SOLN
INTRAVENOUS | Status: DC
Start: 2016-02-25 — End: 2016-02-26
  Administered 2016-02-25: 09:00:00 via INTRAVENOUS

## 2016-02-25 MED ORDER — CALCIUM CARBONATE-VITAMIN D 500-200 MG-UNIT PO TABS
1.0000 | ORAL_TABLET | Freq: Two times a day (BID) | ORAL | Status: DC
  Administered 2016-02-25 – 2016-02-26 (×2): 1 via ORAL
  Filled 2016-02-25 (×3): qty 1

## 2016-02-25 MED ORDER — PROPOFOL 10 MG/ML IV BOLUS
INTRAVENOUS | Status: DC | PRN
  Administered 2016-02-25: 120 mg via INTRAVENOUS

## 2016-02-25 MED ORDER — IBUPROFEN 100 MG/5ML PO SUSP
400.0000 mg | Freq: Four times a day (QID) | ORAL | Status: DC | PRN
  Filled 2016-02-25: qty 20

## 2016-02-25 MED ORDER — FENTANYL CITRATE (PF) 250 MCG/5ML IJ SOLN
INTRAMUSCULAR | Status: AC
  Filled 2016-02-25: qty 5

## 2016-02-25 MED ORDER — OXYCODONE HCL 5 MG/5ML PO SOLN: 5.0000 mg | Freq: Once | ORAL | Status: DC | PRN

## 2016-02-25 MED ORDER — SUGAMMADEX SODIUM 200 MG/2ML IV SOLN
INTRAVENOUS | Status: DC | PRN
  Administered 2016-02-25: 160 mg via INTRAVENOUS

## 2016-02-25 MED ORDER — OXYCODONE HCL 5 MG PO TABS: 5.0000 mg | ORAL_TABLET | Freq: Once | ORAL | Status: DC | PRN

## 2016-02-25 MED ORDER — SIMVASTATIN 10 MG PO TABS
10.0000 mg | ORAL_TABLET | Freq: Every day | ORAL | Status: DC
  Administered 2016-02-25: 10 mg via ORAL
  Filled 2016-02-25: qty 1

## 2016-02-25 MED ORDER — LIDOCAINE HCL (CARDIAC) 20 MG/ML IV SOLN
INTRAVENOUS | Status: DC | PRN
  Administered 2016-02-25: 40 mg via INTRAVENOUS

## 2016-02-25 MED ORDER — HYDROCODONE-ACETAMINOPHEN 7.5-325 MG/15ML PO SOLN
10.0000 mL | ORAL | Status: DC | PRN
  Administered 2016-02-25: 10 mL via ORAL
  Filled 2016-02-25: qty 15

## 2016-02-25 MED ORDER — LABETALOL HCL 5 MG/ML IV SOLN
INTRAVENOUS | Status: DC | PRN
  Administered 2016-02-25: 10 mg via INTRAVENOUS

## 2016-02-25 MED ORDER — ROCURONIUM BROMIDE 50 MG/5ML IV SOLN
INTRAVENOUS | Status: AC
  Filled 2016-02-25: qty 3

## 2016-02-25 MED ORDER — PROPOFOL 10 MG/ML IV BOLUS
INTRAVENOUS | Status: AC
  Filled 2016-02-25: qty 20

## 2016-02-25 MED ORDER — METOPROLOL TARTRATE 12.5 MG HALF TABLET
12.5000 mg | ORAL_TABLET | Freq: Two times a day (BID) | ORAL | Status: DC
  Administered 2016-02-25 – 2016-02-26 (×2): 12.5 mg via ORAL
  Filled 2016-02-25 (×2): qty 1

## 2016-02-25 MED ORDER — ESMOLOL HCL 100 MG/10ML IV SOLN
INTRAVENOUS | Status: AC
  Filled 2016-02-25: qty 10

## 2016-02-25 MED ORDER — FENTANYL CITRATE (PF) 100 MCG/2ML IJ SOLN: 25.0000 ug | INTRAMUSCULAR | Status: DC | PRN

## 2016-02-25 MED ORDER — CLINDAMYCIN PHOSPHATE 600 MG/50ML IV SOLN
600.0000 mg | Freq: Three times a day (TID) | INTRAVENOUS | Status: AC
  Administered 2016-02-25 – 2016-02-26 (×3): 600 mg via INTRAVENOUS
  Filled 2016-02-25 (×3): qty 50

## 2016-02-25 MED ORDER — 0.9 % SODIUM CHLORIDE (POUR BTL) OPTIME
TOPICAL | Status: DC | PRN
  Administered 2016-02-25: 1000 mL

## 2016-02-25 MED ORDER — ESMOLOL HCL 100 MG/10ML IV SOLN
INTRAVENOUS | Status: DC | PRN
  Administered 2016-02-25: 30 mg via INTRAVENOUS
  Administered 2016-02-25: 40 mg via INTRAVENOUS
  Administered 2016-02-25: 20 mg via INTRAVENOUS

## 2016-02-25 MED ORDER — FLUTICASONE PROPIONATE 50 MCG/ACT NA SUSP
1.0000 | Freq: Every day | NASAL | Status: DC
Start: 2016-02-25 — End: 2016-02-25

## 2016-02-25 MED ORDER — CLOPIDOGREL BISULFATE 75 MG PO TABS: 75.0000 mg | ORAL_TABLET | Freq: Every day | ORAL | Status: DC

## 2016-02-25 MED ORDER — METOPROLOL TARTRATE 5 MG/5ML IV SOLN
INTRAVENOUS | Status: DC | PRN
  Administered 2016-02-25 (×3): 1 mg via INTRAVENOUS
  Administered 2016-02-25: 2 mg via INTRAVENOUS

## 2016-02-25 MED ORDER — CLOPIDOGREL BISULFATE 75 MG PO TABS
75.0000 mg | ORAL_TABLET | Freq: Every day | ORAL | Status: DC
  Filled 2016-02-25: qty 1

## 2016-02-25 MED ORDER — LABETALOL HCL 5 MG/ML IV SOLN
INTRAVENOUS | Status: AC
  Filled 2016-02-25: qty 4

## 2016-02-25 MED ORDER — LIDOCAINE 2% (20 MG/ML) 5 ML SYRINGE
INTRAMUSCULAR | Status: AC
  Filled 2016-02-25: qty 15

## 2016-02-25 MED ORDER — LIDOCAINE 2% (20 MG/ML) 5 ML SYRINGE
INTRAMUSCULAR | Status: AC
  Filled 2016-02-25: qty 5

## 2016-02-25 MED ORDER — ROCURONIUM BROMIDE 100 MG/10ML IV SOLN
INTRAVENOUS | Status: DC | PRN
  Administered 2016-02-25: 20 mg via INTRAVENOUS
  Administered 2016-02-25: 40 mg via INTRAVENOUS

## 2016-02-25 MED ORDER — LEVOTHYROXINE SODIUM 88 MCG PO TABS
88.0000 ug | ORAL_TABLET | Freq: Every day | ORAL | Status: DC
  Administered 2016-02-26: 88 ug via ORAL
  Filled 2016-02-25: qty 1

## 2016-02-25 MED ORDER — PROMETHAZINE HCL 25 MG RE SUPP: 25.0000 mg | Freq: Four times a day (QID) | RECTAL | Status: DC | PRN

## 2016-02-25 MED ORDER — ONDANSETRON HCL 4 MG/2ML IJ SOLN: 4.0000 mg | Freq: Once | INTRAMUSCULAR | Status: DC | PRN

## 2016-02-25 MED ORDER — FENTANYL CITRATE (PF) 100 MCG/2ML IJ SOLN
INTRAMUSCULAR | Status: DC | PRN
  Administered 2016-02-25 (×5): 50 ug via INTRAVENOUS
  Administered 2016-02-25: 100 ug via INTRAVENOUS
  Administered 2016-02-25: 50 ug via INTRAVENOUS

## 2016-02-25 MED ORDER — ADULT MULTIVITAMIN W/MINERALS CH
1.0000 | ORAL_TABLET | Freq: Every day | ORAL | Status: DC
  Administered 2016-02-26: 1 via ORAL
  Filled 2016-02-25: qty 1

## 2016-02-25 MED ORDER — CALCIUM-VITAMIN D 600-400 MG-UNIT PO TABS: 1.0000 | ORAL_TABLET | Freq: Two times a day (BID) | ORAL | Status: DC

## 2016-02-25 MED ORDER — LOSARTAN POTASSIUM 50 MG PO TABS
50.0000 mg | ORAL_TABLET | Freq: Every day | ORAL | Status: DC
  Administered 2016-02-25 – 2016-02-26 (×2): 50 mg via ORAL
  Filled 2016-02-25 (×2): qty 1

## 2016-02-25 MED ORDER — ONDANSETRON HCL 4 MG/2ML IJ SOLN
INTRAMUSCULAR | Status: DC | PRN
  Administered 2016-02-25: 4 mg via INTRAVENOUS

## 2016-02-25 SURGICAL SUPPLY — 18 items
CATH ROBINSON RED A/P 18FR (CATHETERS) ×2 IMPLANT
COVER MAYO STAND STRL (DRAPES) ×2 IMPLANT
COVER SURGICAL LIGHT HANDLE (MISCELLANEOUS) ×2 IMPLANT
COVER TABLE BACK 60X90 (DRAPES) ×2 IMPLANT
CUTTER FLEX LINEAR 45M (STAPLE) ×2 IMPLANT
DRAPE PROXIMA HALF (DRAPES) ×2 IMPLANT
GAUZE SPONGE 4X4 12PLY STRL (GAUZE/BANDAGES/DRESSINGS) ×2 IMPLANT
GLOVE ECLIPSE 7.5 STRL STRAW (GLOVE) ×2 IMPLANT
GUARD TEETH (MISCELLANEOUS) ×4 IMPLANT
KIT ROOM TURNOVER OR (KITS) ×2 IMPLANT
NS IRRIG 1000ML POUR BTL (IV SOLUTION) ×2 IMPLANT
PAD ARMBOARD 7.5X6 YLW CONV (MISCELLANEOUS) ×4 IMPLANT
PATTIES SURGICAL 1/4 X 3 (GAUZE/BANDAGES/DRESSINGS) ×2 IMPLANT
RELOAD STAPLE TA45 3.5 REG BLU (ENDOMECHANICALS) ×4 IMPLANT
SOLUTION ANTI FOG 6CC (MISCELLANEOUS) ×2 IMPLANT
SURGILUBE 2OZ TUBE FLIPTOP (MISCELLANEOUS) ×2 IMPLANT
TOWEL OR 17X24 6PK STRL BLUE (TOWEL DISPOSABLE) ×2 IMPLANT
TUBE CONNECTING 12X1/4 (SUCTIONS) ×2 IMPLANT

## 2016-02-25 NOTE — Transfer of Care (Signed)
Immediate Anesthesia Transfer of Care Note  Patient: Morgan [name]  Procedure(s) Performed: Procedure(s):  ENDOSCOPIC ZENKER'S  DIVERTICULECTOMY  (N/A)  Patient Location: PACU  Anesthesia Type:General  Level of Consciousness: awake, alert , oriented and patient cooperative  Airway & Oxygen Therapy: Patient Spontanous Breathing and Patient connected to nasal cannula oxygen  Post-op Assessment: Report given to RN and Post -op Vital signs reviewed and stable  Post vital signs: Reviewed and stable  Last Vitals: There were no vitals filed for this visit.  Last Pain: There were no vitals filed for this visit.    Patients Stated Pain Goal: 1 ([id number])  Complications: No apparent anesthesia complications

## 2016-02-25 NOTE — Anesthesia Preprocedure Evaluation (Signed)

## 2016-02-25 NOTE — Progress Notes (Signed)
Subjective: Sleepy but no other complaints. Less pain than she expected. Able to drink liquids OK.  Objective: Vital signs in last 24 hours: Temp:  [97.9 F (36.6 C)-98.1 F (36.7 C)] 98.1 F (36.7 C) (07/21 1407) Pulse Rate:  [78-91] 84 (07/21 1407) Resp:  [13-20] 14 (07/21 1407) BP: (151-167)/(74-91) 160/91 mmHg (07/21 1407) SpO2:  [97 %-100 %] 97 % (07/21 1407) Weight change:     Intake/Output from previous day:   Intake/Output this shift: Total I/O In: 600 [I.V.:600] Out: 30 [Blood:30]  PHYSICAL EXAM: No neck swelling. Breathing eadily.  Lab Results: No results for input(s): WBC, HGB, HCT, PLT in the last 72 hours. BMET No results for input(s): NA, K, CL, CO2, GLUCOSE, BUN, CREATININE, CALCIUM in the last 72 hours.  Studies/Results: No results found.  Medications: I have reviewed the patient's current medications.  Assessment/Plan: Stable post op. Continue overnight obs.     Evalie Hargraves 02/25/2016, 5:08 PM

## 2016-02-25 NOTE — H&P (View-Only) (Signed)
Otolaryngology Office Note  HPI:   Morgan [name] is a 82 y.o. female who presents as a consult patient with a chief complaint of Difficulty swallowing. She has had progressive difficulty swallowing for about a year. She had a barium swallow recently which revealed a Zenker diverticulum. She was referred here for possible treatment. She has a history of significant caffeine consumption up until a few years ago. She now lives with her daughter and they do not drink any caffeine. She never smoked or drank alcohol. She has lost about 12-14 pounds over the past 8 months or so. She has had to modify her diet and has been eating a lot of soup.   PMH/Meds/All/SocHx/FamHx/ROS:    Past Medical History       Past Medical History:  Diagnosis Date  . Arthritis   . Breast CA ([place])   . Diabetes mellitus ([place])   . Hearing loss   . Hypertension   . Hypothyroid   . Osteopathia   . TIA (transient ischemic attack)        Past Surgical History        Past Surgical History:  Procedure Laterality Date  . CATARACT EXTRACTION    . CHOLECYSTECTOMY    . MASTECTOMY        No family history of bleeding disorders, wound healing problems or difficulty with anesthesia.    Social History   Social History        Social History  . Marital status: Widowed    Spouse name: N/A  . Number of children: N/A  . Years of education: N/A      Occupational History  . Not on file.       Social History Main Topics  . Smoking status: Never Smoker  . Smokeless tobacco: Never Used  . Alcohol use No  . Drug use: No  . Sexual activity: Not on file       Other Topics Concern  . Not on file      Social History Narrative  . No narrative on file       Current Outpatient Prescriptions:  .  losartan (COZAAR) 50 MG tablet, Take 50 mg by mouth daily., Disp: , Rfl:  .  calcium carbonate-vitamin D3 600 mg(1,500mg ) -400 unit Tab, Take by mouth., Disp: , Rfl:  .   clopidogrel (PLAVIX) 75 mg tablet  *ANTIPLATELET*, Take by mouth., Disp: , Rfl:  .  levothyroxine (SYNTHROID, LEVOTHROID) 88 MCG tablet, Take 88 mcg by mouth., Disp: , Rfl:  .  metoPROLOL succinate (TOPROL-XL) 25 MG 24 hr tablet, Take by mouth., Disp: , Rfl:  .  multivitamin (MULTIVITAMIN) per tablet, Take 1 tablet by mouth., Disp: , Rfl:  .  simvastatin (ZOCOR) 10 MG tablet, Take by mouth., Disp: , Rfl:   A complete ROS was performed with pertinent positives/negatives noted in the HPI. The remainder of the ROS are negative.   Physical Exam:    BP 138/47  Ht 1.575 m (5\' 2" )  Wt 58.5 kg (129 lb)  BMI 23.59 kg/m2   General:  Healthy and alert, in no distress, breathing easily. Normal affect. In a pleasant mood. Head: Normocephalic, atraumatic. No masses, or scars. Eyes: Pupils are equal, and reactive to light. Vision is grossly intact. No spontaneous or gaze nystagmus. Ears: Ear canals are clear. Tympanic membranes are intact, with normal landmarks and the middle ears are clear and healthy. Hearing: Grossly normal. Nose. Nasal cavities are clear with healthy mucosa, no polyps or exudate.Airways  are patent. Face: No masses or scars, facial nerve function is symmetric. Oral Cavity: No mucosal abnormalities are noted. Tongue with normal mobility. Dentition appears healthy. Oropharynx: Tonsils are symmetric. There are no mucosal masses identified. Tongue base appears normal and healthy. Larynx/Hypopharynx: indirect exam reveals healthy, mobile vocal cords, without mucosal lesions in the hypopharynx or larynx. Neck: No palpable masses, no cervical adenopathy, no thyroid nodules or enlargement. Neuro: Cranial nerves II-XII will normal function. Balance: Normal gate. Other findings: none.   Independent Review of Additional Tests or Records:  none  Procedures:  none   Impression & Plans:  Morgan [name] is a 82 y.o. female with Zenker diverticulum, otherwise normal head and  neck exam. We discussed the possible role of chronic reflux and causing Zenker diverticulum. She is currently without dietary risk factors for reflux. She has lost some weight and has had to modify her diet so surgical intervention is appropriate. We discussed endoscopic diverticulectomy. We discussed risks and benefits. We discussed overnight stay in the hospital. We will have to stop her Plavix preop. She is going to change primary care physician so she wants to wait until after that has been established. They will contact us with questions or when they are ready to schedule.

## 2016-02-25 NOTE — Anesthesia Procedure Notes (Signed)
Procedure Name: Intubation Date/Time: 02/25/2016 11:12 AM Performed by: Salli Quarry Shevaun Lovan Pre-anesthesia Checklist: Patient identified, Emergency Drugs available, Suction available and Patient being monitored Patient Re-evaluated:Patient Re-evaluated prior to inductionOxygen Delivery Method: Circle System Utilized Preoxygenation: Pre-oxygenation with 100% oxygen Intubation Type: IV induction Ventilation: Mask ventilation without difficulty Laryngoscope Size: Mac and 3 Grade View: Grade I Tube type: Oral Tube size: 7.0 mm Number of attempts: 1 Airway Equipment and Method: Stylet and Oral airway Placement Confirmation: ETT inserted through vocal cords under direct vision,  positive ETCO2 and breath sounds checked- equal and bilateral Secured at: 22 cm Tube secured with: Tape Dental Injury: Teeth and Oropharynx as per pre-operative assessment

## 2016-02-25 NOTE — Interval H&P Note (Signed)
History and Physical Interval Note:  02/25/2016 10:37 AM  Morgan [name]  has presented today for surgery, with the diagnosis of ZENKERS DIVERTICULUM  The various methods of treatment have been discussed with the patient and family. After consideration of risks, benefits and other options for treatment, the patient has consented to  Procedure(s):  ENDOSCOPIC ZENKER'S  DIVERTICULECTOMY  (N/A) as a surgical intervention .  The patient's history has been reviewed, patient examined, no change in status, stable for surgery.  I have reviewed the patient's chart and labs.  Questions were answered to the patient's satisfaction.     Theodus Ran

## 2016-02-25 NOTE — Anesthesia Postprocedure Evaluation (Signed)
Anesthesia Post Note  Patient: Morgan [name]  Procedure(s) Performed: Procedure(s) (LRB):  ENDOSCOPIC ZENKER'S  DIVERTICULECTOMY  (N/A)  Patient location during evaluation: PACU Anesthesia Type: General Level of consciousness: awake, awake and alert and oriented Pain management: pain level controlled Vital Signs Assessment: post-procedure vital signs reviewed and stable Respiratory status: spontaneous breathing, nonlabored ventilation and respiratory function stable Cardiovascular status: blood pressure returned to baseline Anesthetic complications: no    Last Vitals:  Filed Vitals:   02/25/16 1350 02/25/16 1407  BP: 161/74 160/91  Pulse: 78 84  Temp: 36.6 C 36.7 C  Resp: 14 14    Last Pain: There were no vitals filed for this visit.               Tryphena Perkovich COKER

## 2016-02-25 NOTE — Op Note (Signed)
OPERATIVE REPORT  DATE OF SURGERY: 02/25/2016  PATIENT:  Morgan [name],  82 y.o. female  PRE-OPERATIVE DIAGNOSIS:  ZENKERS DIVERTICULUM  POST-OPERATIVE DIAGNOSIS:  ZENKERS DIVERTICULUM  PROCEDURE:  Procedure(s):  ENDOSCOPIC ZENKER'S  DIVERTICULECTOMY   SURGEON:  Beckie Salts, MD  ASSISTANTS: None  ANESTHESIA:   General   EBL:  10 ml  DRAINS: None  LOCAL MEDICATIONS USED:  None  SPECIMEN:  none  COUNTS:  Correct  PROCEDURE DETAILS: The patient was taken to the operating room and placed on the operating table in the supine position. Following induction of general endotracheal anesthesia, the patient was draped in a standard fashion and the table was turned 90. A maxillary tooth protector was used throughout the case. Moistened gauze was used to protect the lower dentition. The Weerda scope was used to evaluate the pharynx and esophagus. Cervical esophagoscope and a Jako laryngoscope were also used to help confirm the anatomy. A 20 French Maloney esophageal dilator was used to orient the esophagus. Multiple attempts were made with the [facility] scope to insert the blades appropriately in the esophagus and in the diverticulum. With great difficulty, was eventually able to position the scope properly. The suspension apparatus was used. The GIA stapler was used to divide the cricopharyngeal band. 2 cartridges were used. There was nice opening of the pouch into the esophagus. There is a small tear of the posterior pharyngeal wall about 4 cm above the cricopharyngeus. There is no significant bleeding. Scopes were removed. Patient was awakened extubated and transferred to recovery in stable condition.    PATIENT DISPOSITION:  To PACU, stable

## 2016-02-26 DIAGNOSIS — K225 Diverticulum of esophagus, acquired: Secondary | ICD-10-CM | POA: Diagnosis not present

## 2016-02-26 MED ORDER — HYDROCODONE-ACETAMINOPHEN 7.5-325 MG PO TABS: 1.0000 | ORAL_TABLET | Freq: Four times a day (QID) | ORAL | Status: AC | PRN

## 2016-02-26 MED ORDER — PROMETHAZINE HCL 25 MG RE SUPP: 25.0000 mg | Freq: Four times a day (QID) | RECTAL | Status: AC | PRN

## 2016-02-26 NOTE — Progress Notes (Addendum)
Patient discharged to home. VSS. AVS given and patient agrees and verbalizes understanding. Patient is waiting for walker to be delivered prior to discharge. Patient is accompanied by her daughter. No further questions at this moment.  Morgan [name] n 02/26/2016 3:02 PM  Discharged at 1630 after walker brought to room. Donzetta Matters [facility]

## 2016-02-26 NOTE — Discharge Summary (Signed)
  Physician Discharge Summary  Patient ID: Morgan [name] MRN: [id number] DOB/AGE: [id number] 80 y.o.  Admit date: 02/25/2016 Discharge date: 02/26/2016  Admission Diagnoses:Zenker's diverticulum  Discharge Diagnoses:  Active Problems:   Zenker's (hypopharyngeal) diverticulum   Discharged Condition: good  Hospital Course: no complications  Consults: none  Significant Diagnostic Studies: none  Treatments: surgery: endoscopic diverticulectomy  Discharge Exam: Blood pressure 156/49, pulse 75, temperature 98.2 F (36.8 C), temperature source Oral, resp. rate 16, height 5\' 2"  (1.575 m), weight 68.947 kg (152 lb), SpO2 99 %. PHYSICAL EXAM: Awake and alert, able to take po liquids well. Minimal pain. No swelling in neck, airway normal.  Disposition:   Discharge Instructions    Increase activity slowly    Complete by:  As directed             Medication List    TAKE these medications        Calcium-Vitamin D 600-400 MG-UNIT Tabs  Take 1-2 tablets by mouth 2 (two) times daily. 1 tablet in the morning, 2 tablets in the evening     clopidogrel 75 MG tablet  Commonly known as:  PLAVIX  Take 75 mg by mouth daily.     fluticasone 50 MCG/ACT nasal spray  Commonly known as:  FLONASE  Place 1 spray into both nostrils daily.     HYDROcodone-acetaminophen 7.5-325 MG tablet  Commonly known as:  NORCO  Take 1 tablet by mouth every 6 (six) hours as needed for moderate pain.     levothyroxine 88 MCG tablet  Commonly known as:  SYNTHROID, LEVOTHROID  Take 88 mcg by mouth daily.     losartan 50 MG tablet  Commonly known as:  COZAAR  Take 50 mg by mouth daily.     metoprolol tartrate 25 MG tablet  Commonly known as:  LOPRESSOR  Take 12.5 mg by mouth 2 (two) times daily.     multivitamin tablet  Take 1 tablet by mouth daily.     promethazine 25 MG suppository  Commonly known as:  PHENERGAN  Place 1 suppository (25 mg total) rectally every 6 (six) hours as needed for  nausea or vomiting.     simvastatin 10 MG tablet  Commonly known as:  ZOCOR  Take 10 mg by mouth at bedtime.         SignedConstance [name], Morgan [name] 02/26/2016, 10:04 AM

## 2016-02-26 NOTE — Progress Notes (Signed)
Pt. Just had a few bites from her tray

## 2016-02-26 NOTE — Discharge Instructions (Signed)
Resume plavix in one week.  Stay on soft diet until next office visit. Call immediately if any neck swelling, difficulty swallowing or breathing.

## 2016-02-26 NOTE — Progress Notes (Signed)
CM received call from RN to please arrange for a rollator.  CM called [facility] DME rep, Jermaine to please deliver a rollator to room so pt can be discharged.

## 2016-02-27 ENCOUNTER — Encounter (HOSPITAL_COMMUNITY): Payer: Self-pay | Admitting: Otolaryngology

## 2016-06-19 ENCOUNTER — Telehealth: Payer: Self-pay | Admitting: Hematology

## 2016-06-19 NOTE — Telephone Encounter (Signed)
Lvm confirming appt appt 11/17 @ 12.30.

## 2016-06-23 ENCOUNTER — Ambulatory Visit: Payer: PRIVATE HEALTH INSURANCE | Admitting: Hematology

## 2016-06-23 ENCOUNTER — Other Ambulatory Visit: Payer: PRIVATE HEALTH INSURANCE

## 2016-07-06 NOTE — Progress Notes (Signed)
[place] ONCOLOGY OFFICE PROGRESS NOTE DATE OF VISIT: 06/25/2015  Morgan [name], [place], [place] [address]  DIAGNOSIS: Bilateral malignant neoplasm of breast in female, estrogen receptor positive, unspecified site of breast ([place]) - Plan: CBC with Differential, Comprehensive metabolic panel  Chief Complaint  Patient presents with  . Follow-up  . Breast Cancer   PROBLEM LIST:  1. Carcinoma of the right breast diagnosed in June 2005, stage IIIC with 15/19 positive lymph nodes. Estrogen and progesterone receptors were strongly positive. HER-2/neu was negative by FISH.  The patient underwent a right modified radical mastectomy on 02/04/2004. She received 5 cycles of adjuvant chemotherapy with epirubicin, Cytoxan and Taxotere. Treatments were initiated on 03/15/2004 concluded May 11, 2004. The patient then received radiation treatments from June 13, 2004 through July 28, 2004. She was then on adjuvant Arimidex from August 15, 2004 through January 2011 and has remained disease free.  2. Left breast carcinoma dating back to August 1989, stage IIIB status post left modified radical mastectomy on 03/31/1988. There was dermal lymphatic invasion pathologically. Also peau d'orange. 4/11 lymph nodes were positive. Estrogen receptor was 29, progesterone receptor 43. The patient received adjuvant chemotherapy with CAF for 6 cycles from September 1989 through January 1990. Cumulative Adriamycin was 670 mg equal to 335 mg per m squared. The patient then received radiation treatments 5040 cGy plus a 540 cGy boost from August 28, 1988 through October 09, 1988.  3. History of lymphedema of the right upper extremity in June 2007 treated at the [facility].  4. Diabetes mellitus.  5. Hypertension.  6. Hypothyroidism.  7. Dyslipidemia.  8. Obesity.  9. Osteopenia.  10. Mild anemia noted on 06/13/2012.  11. 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 Danella Sensing  on 05/28/2012.   CURRENT THERAPY: Observation.    INTERVAL HISTORY:  Morgan [name] 82 y.o. female with a history of bilateral breast cancers, is here for follow-up. She has been doing well. She is accompanied by her daughter. She has not been getting around as easily in the last year. She uses a Immunologist while walking in the yard. She does not use a cane to ambulate. She was given a walker in July but does not use it. She fell once but landed on the dog's bed so she was not injured. She denies any pain or issues with her stomach. She has no concerns or complaints at this time. She has a regular physical coming up in the next few weeks - she has these every 6 months. She does not currently check her sugar at home but did previously. She explains her blood pressure may have been elevated because she gets nervous before doctor's visits. She also took her blood pressure medication later this morning than usual because she had a fasting lab drawn this morning. She is on a blood thinner and bruises easily. She does not recall injuring her chest recently.    MEDICAL HISTORY: Past Medical History:  Diagnosis Date  . Arthritis   . Cancer [facility])    breast bilateral  . Complication of chemotherapy   . Diabetes mellitus without complication ([place])    medication stiopped 5 months ago  checks CBG 2 times monthly  . Dysrhythmia   . Hearing loss   . Hyperlipidemia   . Hypertension   . Hypothyroidism   . S/P radiation therapy   .  Stroke [facility])    TIA  . Thyroid disease    hypothyroidism    INTERIM HISTORY: has Breast cancer ([place]); Osteopenia; Anemia, unspecified; and Zenker's (hypopharyngeal) diverticulum on her problem list.    ALLERGIES:  is allergic to other and shrimp [shellfish allergy].  MEDICATIONS: has a current medication list which includes the following prescription(s): calcium-vitamin d,  clopidogrel, fluticasone, hydrocodone-acetaminophen, levothyroxine, losartan, metoprolol tartrate, multivitamin, promethazine, and simvastatin.  SURGICAL HISTORY:  Past Surgical History:  Procedure Laterality Date  . CHOLECYSTECTOMY  1978?  [facility] EYE SURGERY Bilateral    cataracts  . MASTECTOMY  1989/2005  . SQUAMOUS CELL CARCINOMA EXCISION    . ZENKER'S DIVERTICULECTOMY ENDOSCOPIC N/A 02/25/2016   Procedure: ENDOSCOPIC ZENKER'S  DIVERTICULECTOMY;  Surgeon: Izora Gala, MD;  Location: [facility];  Service: ENT;  Laterality: N/A;    REVIEW OF SYSTEMS:   Constitutional: Denies fevers, chills or abnormal weight loss Eyes: Denies blurriness of vision Ears, nose, mouth, throat, and face: Denies mucositis or sore throat Respiratory: Denies cough, dyspnea or wheezes Cardiovascular: Denies palpitation, chest discomfort or lower extremity swelling Gastrointestinal:  Denies nausea, heartburn or change in bowel habits Skin: Denies abnormal skin rashes Lymphatics: Denies new lymphadenopathy or easy bruising Neurological:Denies numbness, tingling or new weaknesses Behavioral/Psych: Mood is stable, no new changes  All other systems were reviewed with the patient and are negative.  PHYSICAL EXAMINATION: ECOG PERFORMANCE STATUS: 0  Blood pressure (!) 184/61, pulse 81, temperature 97.5 F (36.4 C), temperature source Oral, resp. rate 18, height 5' 2"  (1.575 m), weight 159 lb 1.6 oz (72.2 kg), SpO2 100 %.  GENERAL:alert, no distress and comfortable; elderly female.  Cautions gait.  SKIN: skin color, texture, turgor are normal, no rashes or significant lesions. (+) Bruise resolving on left upper chest EYES: normal, Conjunctiva are pink and non-injected, sclera clear OROPHARYNX:no exudate, no erythema and lips, buccal mucosa, and tongue normal  NECK: supple, thyroid normal size, non-tender, without nodularity LYMPH:  no palpable lymphadenopathy in the cervical, axillary or supraclavicular LUNGS: clear to  auscultation and percussion with normal breathing effort HEART: regular rate & rhythm and no murmurs and no lower extremity edema BREAST: s/p bilateral mastectomy. Both her right and left  chest wall reveals scars well healed without evidence of recurrence within the chest wall and without lymphadenopathy.  ABDOMEN:abdomen soft, non-tender and normal bowel sounds Musculoskeletal:no cyanosis of digits and no clubbing  NEURO: alert & oriented x 3 with fluent speech, no focal motor/sensory deficits   LABORATORY DATA: CBC Latest Ref Rng & Units 07/07/2016 02/18/2016 06/18/2015  WBC 3.9 - 10.3 10e3/uL 7.8 8.0 8.2  Hemoglobin 11.6 - 15.9 g/dL 12.6 11.8(L) 11.5(L)  Hematocrit 34.8 - 46.6 % 38.3 36.4 33.7(L)  Platelets 145 - 400 10e3/uL 241 227 207    CMP Latest Ref Rng & Units 07/07/2016 02/18/2016 06/18/2015  Glucose 70 - 140 mg/dl 152(H) 127(H) 79  BUN 7.0 - 26.0 mg/dL 27.5(H) 17 18.7  Creatinine 0.6 - 1.1 mg/dL 1.3(H) 1.21(H) 1.1  Sodium 136 - 145 mEq/L 138 142 144  Potassium 3.5 - 5.1 mEq/L 4.4 3.8 4.4  Chloride 101 - 111 mmol/L - 107 -  CO2 22 - 29 mEq/L 26 28 25   Calcium 8.4 - 10.4 mg/dL 9.2 9.4 9.4  Total Protein 6.4 - 8.3 g/dL 7.6 - 6.8  Total Bilirubin 0.20 - 1.20 mg/dL 0.53 - 0.40  Alkaline Phos 40 - 150 U/L 76 - 58  AST 5 - 34 U/L 25 - 19  ALT 0 -  55 U/L 17 - 14     IMAGING STUDIES:  1. Chest x-ray, 2 view, from 06/18/2015  IMPRESSION: 1. Mild hyperinflation slightly more conspicuous than on the previous study. There is no evidence of pneumonia, CHF, nor other acute cardiopulmonary abnormality. There are no findings suspicious for metastatic disease. 2. Previous bilateral mastectomy and axillary lymph node dissections. 3. Stable moderate dextrocurvature of the mid to lower thoracic Spine.  2. Bone density scan carried out at [facility] on 04/10/2012  showed T-score of -1.9 involving the right femoral neck. There was a  statistically significant interval  increase in the bone mineral density  of the lumbar spine of 13.7% compared to the exam of 09/28/2009. T-  score of the left femur was also -1.7, as it was for the right femur.  ASSESSMENT: Morgan [name] 82 y.o. female with a history of Bilateral malignant neoplasm of breast in female, estrogen receptor positive, unspecified site of breast ([place]) - Plan: CBC with Differential, Comprehensive metabolic panel  PLAN:  1. History of bilateral Breast cancers.  -- Morgan [name] continues to do well with no signs of recurrent breast cancer, now over 27 years from the time of diagnosis of her first breast cancer involving the left breast and now about 11 years from the time of diagnosis of the cancer involving the right breast. -Her physical exam reviewed no evidence of local recurrence. -She is clinically doing very well, lab reviewed, mild anemia stable, no other lab abnormalities. -Her risk of breast cancer recurrence is minimal, she is clinically doing very well, due to her advanced age and comorbidities, I recommend her to follow-up with her primary care physician for breast cancer surveillance. Patient agrees.  2. Osteopenia. -- Continue her vitamin d and calcium supplementation.  We also counseled her on fall precautions.   3. Anemia. -She has a mild anemia, very stable over years. Hemoglobin 12.6, possibly related to her age and previous chemotherapy.  -Her previous ferritin and iron studies were normal.  4. Unsteady gait -I encouraged the patient to begin using a cane to ambulate to avoid falls  4. Follow-up. --Labs reviewed. --I have released the patient to her primary care physician. --She is welcome to return to our clinic as needed.   All questions were answered. The patient knows to call the clinic with any problems, questions or concerns. We can certainly see the patient much sooner if necessary. I wished her a very happy Birthday.  I spent 15 minutes counseling the patient face to  face. The total time spent in the appointment was 20 minutes.  This document serves as a record of services personally performed by Truitt Merle, MD. It was created on her behalf by Arlyce Harman, a trained medical scribe. The creation of this record is based on the scribe's personal observations and the provider's statements to them. This document has been checked and approved by the attending provider.   Truitt Merle MD  07/07/2016

## 2016-07-07 ENCOUNTER — Ambulatory Visit (HOSPITAL_BASED_OUTPATIENT_CLINIC_OR_DEPARTMENT_OTHER): Payer: Medicare Other | Admitting: Hematology

## 2016-07-07 ENCOUNTER — Other Ambulatory Visit (HOSPITAL_BASED_OUTPATIENT_CLINIC_OR_DEPARTMENT_OTHER): Payer: Medicare Other

## 2016-07-07 ENCOUNTER — Encounter: Payer: Self-pay | Admitting: Hematology

## 2016-07-07 VITALS — BP 184/61 | HR 81 | Temp 97.5°F | Resp 18 | Ht 62.0 in | Wt 159.1 lb

## 2016-07-07 DIAGNOSIS — I10 Essential (primary) hypertension: Secondary | ICD-10-CM

## 2016-07-07 DIAGNOSIS — D649 Anemia, unspecified: Secondary | ICD-10-CM | POA: Diagnosis not present

## 2016-07-07 DIAGNOSIS — R2689 Other abnormalities of gait and mobility: Secondary | ICD-10-CM

## 2016-07-07 DIAGNOSIS — Z853 Personal history of malignant neoplasm of breast: Secondary | ICD-10-CM

## 2016-07-07 DIAGNOSIS — C50912 Malignant neoplasm of unspecified site of left female breast: Principal | ICD-10-CM

## 2016-07-07 DIAGNOSIS — C50911 Malignant neoplasm of unspecified site of right female breast: Secondary | ICD-10-CM

## 2016-07-07 DIAGNOSIS — Z170 Estrogen receptor positive status [ER+]: Principal | ICD-10-CM

## 2016-07-07 DIAGNOSIS — E039 Hypothyroidism, unspecified: Secondary | ICD-10-CM | POA: Diagnosis not present

## 2016-07-07 DIAGNOSIS — E119 Type 2 diabetes mellitus without complications: Secondary | ICD-10-CM

## 2016-07-07 DIAGNOSIS — M8580 Other specified disorders of bone density and structure, unspecified site: Secondary | ICD-10-CM | POA: Diagnosis not present

## 2016-07-07 LAB — CBC WITH DIFFERENTIAL/PLATELET
BASO%: 1.4 % (ref 0.0–2.0)
Basophils Absolute: 0.1 10*3/uL (ref 0.0–0.1)
EOS%: 2.3 % (ref 0.0–7.0)
Eosinophils Absolute: 0.2 10*3/uL (ref 0.0–0.5)
HCT: 38.3 % (ref 34.8–46.6)
HGB: 12.6 g/dL (ref 11.6–15.9)
LYMPH%: 27.9 % (ref 14.0–49.7)
MCH: 30.2 pg (ref 25.1–34.0)
MCHC: 32.8 g/dL (ref 31.5–36.0)
MCV: 92.1 fL (ref 79.5–101.0)
MONO#: 0.9 10*3/uL (ref 0.1–0.9)
MONO%: 11 % (ref 0.0–14.0)
NEUT#: 4.5 10*3/uL (ref 1.5–6.5)
NEUT%: 57.4 % (ref 38.4–76.8)
Platelets: 241 10*3/uL (ref 145–400)
RBC: 4.16 10*6/uL (ref 3.70–5.45)
RDW: 13.6 % (ref 11.2–14.5)
WBC: 7.8 10*3/uL (ref 3.9–10.3)
lymph#: 2.2 10*3/uL (ref 0.9–3.3)

## 2016-07-07 LAB — COMPREHENSIVE METABOLIC PANEL
ALT: 17 U/L (ref 0–55)
AST: 25 U/L (ref 5–34)
Albumin: 3.6 g/dL (ref 3.5–5.0)
Alkaline Phosphatase: 76 U/L (ref 40–150)
Anion Gap: 9 mEq/L (ref 3–11)
BUN: 27.5 mg/dL — ABNORMAL HIGH (ref 7.0–26.0)
CO2: 26 mEq/L (ref 22–29)
Calcium: 9.2 mg/dL (ref 8.4–10.4)
Chloride: 103 mEq/L (ref 98–109)
Creatinine: 1.3 mg/dL — ABNORMAL HIGH (ref 0.6–1.1)
EGFR: 38 mL/min/{1.73_m2} — ABNORMAL LOW (ref >90)
Glucose: 152 mg/dl — ABNORMAL HIGH (ref 70–140)
Potassium: 4.4 mEq/L (ref 3.5–5.1)
Sodium: 138 mEq/L (ref 136–145)
Total Bilirubin: 0.53 mg/dL (ref 0.20–1.20)
Total Protein: 7.6 g/dL (ref 6.4–8.3)

## 2018-01-04 ENCOUNTER — Ambulatory Visit: Payer: Self-pay | Admitting: Podiatry

## 2018-01-25 ENCOUNTER — Encounter: Payer: Self-pay | Admitting: Podiatry

## 2018-01-25 ENCOUNTER — Ambulatory Visit (INDEPENDENT_AMBULATORY_CARE_PROVIDER_SITE_OTHER): Payer: Medicare Other | Admitting: Podiatry

## 2018-01-25 DIAGNOSIS — M79674 Pain in right toe(s): Secondary | ICD-10-CM | POA: Diagnosis not present

## 2018-01-25 DIAGNOSIS — M2011 Hallux valgus (acquired), right foot: Secondary | ICD-10-CM | POA: Diagnosis not present

## 2018-01-25 DIAGNOSIS — E1142 Type 2 diabetes mellitus with diabetic polyneuropathy: Secondary | ICD-10-CM | POA: Diagnosis not present

## 2018-01-25 DIAGNOSIS — M2012 Hallux valgus (acquired), left foot: Secondary | ICD-10-CM | POA: Diagnosis not present

## 2018-01-25 DIAGNOSIS — B351 Tinea unguium: Secondary | ICD-10-CM

## 2018-01-25 DIAGNOSIS — M79675 Pain in left toe(s): Secondary | ICD-10-CM

## 2018-01-25 DIAGNOSIS — L84 Corns and callosities: Secondary | ICD-10-CM

## 2018-01-29 NOTE — Progress Notes (Signed)
Morgan [name] is an 84 y.o. WF seen today with cc of elongated toenails b/l feet. She relates tender, elongated, thick toenails which are aggravated when wearing enclosed shoe gear and pain interferes with routine tasks. Duration greater than 1 month. Her daughter is present during the visit.  PMH: Medical History Collapse by Default  Collapse by Default      Date Unknown Arthritis  Date Unknown Cancer [facility])   Date Unknown Complication of chemotherapy  Date Unknown Diabetes mellitus without complication ([place])   Date Unknown Dysrhythmia  Date Unknown Hearing loss  Date Unknown Hyperlipidemia  Date Unknown Hypertension  Date Unknown Hypothyroidism  Date Unknown S/P radiation therapy  Date Unknown Stroke [facility])   Date Unknown Thyroid disease    Respiratory  Zenker's (hypopharyngeal) diverticulum   Musculoskeletal and Integument  Osteopenia   Other  History of breast cancer   Anemia, unspecified    Surgical History Collapse by Default  Collapse by Default      02/25/2016 Zenker's diverticulectomy endoscopic (N/A)   1978? Cholecystectomy  Date Unknown Eye surgery (Bilateral)   1989/2005 Mastectomy  Date Unknown Squamous cell carcinoma excision    Medications    Calcium Carb-Cholecalciferol (CALCIUM-VITAMIN D) 600-400 MG-UNIT TABS    clopidogrel (PLAVIX) 75 MG tablet    fluticasone (FLONASE) 50 MCG/ACT nasal spray    levothyroxine (SYNTHROID, LEVOTHROID) 88 MCG tablet    losartan (COZAAR) 50 MG tablet    metoprolol tartrate (LOPRESSOR) 25 MG tablet    Multiple Vitamin (MULTIVITAMIN) tablet    promethazine (PHENERGAN) 25 MG suppository    simvastatin (ZOCOR) 10 MG tablet    HYDROcodone-acetaminophen (NORCO) 7.5-325 MG tablet    Allergies     OtherSwelling  Shrimp [Shellfish Allergy]Swelling      Tobacco History Collapse by Default  Collapse by Default      Smoking Status  Never Smoker  Smokeless Tobacco Status  Never Used    Family History Collapse by Default   Collapse by Default      Mother (Deceased at age 20) Cancer          Father (Deceased at age 23) Heart disease    Diabetes         Brother Heart disease       See detailed diabetic  foot examination for 01/25/18.  Patient has bilateral bunion deformity.  Preulcerative hyperkeratotic lesions noted submetatarsal head 1, 2, 3 b/l  Varicosities b/l.  Assessment: 1. Painful onychomycosis 1-5 b/l 2. Calluses submetatarsal heads 1, 2, 3 b/l 3. NIDDM with diabetic neuropathy 4. Bunion deformity b/l 5. Edema b/l LE  Plan:  1. Discussed diabetic foot care principles. She is high risk due to bunion deformity, loss of protective sensation. 2. Toenails 1-5 b/l debrided in length and girth 3. Calluses pared submetatarsal heads 1, 2, 3 b/l; total lesions =6 4. Discussed Medicare Diabetic Shoe Program; she would like to think about it and revisit on her next visit. 5. Continue soft, supportive shoe gear; she currently wears Skechers 6. Advised daughter to purchase compression knee hi's 15-20 mmHg pressure for edema. Apply every morning; remove every evening. 7. Follow up 3 months.

## 2018-05-03 ENCOUNTER — Encounter: Payer: Self-pay | Admitting: Podiatry

## 2018-05-03 ENCOUNTER — Ambulatory Visit (INDEPENDENT_AMBULATORY_CARE_PROVIDER_SITE_OTHER): Payer: Medicare Other | Admitting: Podiatry

## 2018-05-03 DIAGNOSIS — E1142 Type 2 diabetes mellitus with diabetic polyneuropathy: Secondary | ICD-10-CM

## 2018-05-03 DIAGNOSIS — L84 Corns and callosities: Secondary | ICD-10-CM

## 2018-05-03 DIAGNOSIS — M79674 Pain in right toe(s): Secondary | ICD-10-CM

## 2018-05-03 DIAGNOSIS — M79675 Pain in left toe(s): Secondary | ICD-10-CM | POA: Diagnosis not present

## 2018-05-03 DIAGNOSIS — B351 Tinea unguium: Secondary | ICD-10-CM

## 2018-05-03 NOTE — Progress Notes (Signed)
Subjective: Morgan [name] presents for follow up preventative foot care on today. She voices no new problems on today's visit. Morgan [name] states her feet felt much better after her last visit.  She has diabetic neuropathy, painful mycotic toenails and painful calluses/corns which interfere with activities of daily living. Pain is aggravated when wearing enclosed shoe gear and is relieved with periodic professional debridement.  She states she doesn't feel she needs diabetic shoes right now as she does well with her Skechers with memory foam insoles.  She is requesting moisturizing lotion recommendations on today.  Objective: Vascular Examination: Capillary refill time  immediate x 10 digits Dorsalis pedis pulses present b/l Posterior tibial pulses absent b/l Varicosities BLE No digital hair x 10 digits Skin temperature warm to warm b/l  Dermatological Examination: Skin atrophy b/l Toenails 1-5 b/l discolored, thick, dystrophic with subungual debris and pain with palpation to nailbeds due to thickness of nails. Hyperkeratotic lesions submet head 1 left foot, submet head 1,2, 3 right foot, distal tip right 4th digit, distal tip left 3rd and 4th toes  Musculoskeletal: Muscle strength 5/5 to all LE muscle groups Hammertoe deformity 2-5 b/l HAV with bunion b/l  Neurological: Sensation diminished with 10 gram monofilament. Vibratory sensation diminished  Assessment: 1. Painful onychomycosis toenails 1-5 b/l 2. Calluses x 4 submet head 1 left foot, submet head 1,2, 3 right foot 3. Corns x 3 distal tip right 4th digit, distal tip left 3rd and 4th toes 4. NIDDM with Diabetic neuropathy  Plan: 1. Continue diabetic foot care principles.  2. Toenails 1-5 b/l were debrided in length and girth without iatrogenic bleeding. 3.  Hyperkeratotic lesions pared with sterile chisel blade (Calluses x 4 submet head 1 left foot, submet head 1,2, 3 right foot;  Corns x 3 distal tip right 4th digit,  distal tip left 3rd and 4th toes) 4. Recommended Eucerin Intensive Repair Lotion for daily moisturizing 5. Patient to continue soft, supportive shoe gear 6. Patient to report any pedal injuries to medical professional  7. Follow up 3 months. Patient/POA to call should there be a concern in the interim.

## 2018-05-03 NOTE — Patient Instructions (Signed)
Eucerin Intensive Repair Lotion for moisturizing

## 2018-08-09 ENCOUNTER — Encounter: Payer: Self-pay | Admitting: Podiatry

## 2018-08-09 ENCOUNTER — Ambulatory Visit (INDEPENDENT_AMBULATORY_CARE_PROVIDER_SITE_OTHER): Payer: Medicare Other | Admitting: Podiatry

## 2018-08-09 DIAGNOSIS — E1142 Type 2 diabetes mellitus with diabetic polyneuropathy: Secondary | ICD-10-CM

## 2018-08-09 DIAGNOSIS — B351 Tinea unguium: Secondary | ICD-10-CM

## 2018-08-09 DIAGNOSIS — L84 Corns and callosities: Secondary | ICD-10-CM | POA: Diagnosis not present

## 2018-08-09 DIAGNOSIS — M79675 Pain in left toe(s): Secondary | ICD-10-CM

## 2018-08-09 DIAGNOSIS — M79674 Pain in right toe(s): Secondary | ICD-10-CM

## 2018-08-09 NOTE — Patient Instructions (Signed)
Onychomycosis/Fungal Toenails  WHAT IS IT? An infection that lies within the keratin of your nail plate that is caused by a fungus.  WHY ME? Fungal infections affect all ages, sexes, races, and creeds.  There may be many factors that predispose you to a fungal infection such as age, coexisting medical conditions such as diabetes, or an autoimmune disease; stress, medications, fatigue, genetics, etc.  Bottom line: fungus thrives in a warm, moist environment and your shoes offer such a location.  IS IT CONTAGIOUS? Theoretically, yes.  You do not want to share shoes, nail clippers or files with someone who has fungal toenails.  Walking around barefoot in the same room or sleeping in the same bed is unlikely to transfer the organism.  It is important to realize, however, that fungus can spread easily from one nail to the next on the same foot.  HOW DO WE TREAT THIS?  There are several ways to treat this condition.  Treatment may depend on many factors such as age, medications, pregnancy, liver and kidney conditions, etc.  It is best to ask your doctor which options are available to you.  1. No treatment.   Unlike many other medical concerns, you can live with this condition.  However for many people this can be a painful condition and may lead to ingrown toenails or a bacterial infection.  It is recommended that you keep the nails cut short to help reduce the amount of fungal nail. 2. Topical treatment.  These range from herbal remedies to prescription strength nail lacquers.  About 40-50% effective, topicals require twice daily application for approximately 9 to 12 months or until an entirely new nail has grown out.  The most effective topicals are medical grade medications available through physicians offices. 3. Oral antifungal medications.  With an 80-90% cure rate, the most common oral medication requires 3 to 4 months of therapy and stays in your system for a year as the new nail grows out.  Oral  antifungal medications do require blood work to make sure it is a safe drug for you.  A liver function panel will be performed prior to starting the medication and after the first month of treatment.  It is important to have the blood work performed to avoid any harmful side effects.  In general, this medication safe but blood work is required. 4. Laser Therapy.  This treatment is performed by applying a specialized laser to the affected nail plate.  This therapy is noninvasive, fast, and non-painful.  It is not covered by insurance and is therefore, out of pocket.  The results have been very good with a 80-95% cure rate.  The [facility] is the only practice in the area to offer this therapy. 5. Permanent Nail Avulsion.  Removing the entire nail so that a new nail will not grow back.  Corns and Calluses Corns are small areas of thickened skin that occur on the top, sides, or tip of a toe. They contain a cone-shaped core with a point that can press on a nerve below. This causes pain.  Calluses are areas of thickened skin that can occur anywhere on the body, including the hands, fingers, palms, soles of the feet, and heels. Calluses are usually larger than corns. What are the causes? Corns and calluses are caused by rubbing (friction) or pressure, such as from shoes that are too tight or do not fit properly. What increases the risk? Corns are more likely to develop in people   who have misshapen toes (toe deformities), such as hammer toes. Calluses can occur with friction to any area of the skin. They are more likely to develop in people who:  Work with their hands.  Wear shoes that fit poorly, are too tight, or are high-heeled.  Have toe deformities. What are the signs or symptoms? Symptoms of a corn or callus include:  A hard growth on the skin.  Pain or tenderness under the skin.  Redness and swelling.  Increased discomfort while wearing tight-fitting shoes, if your feet are  affected. If a corn or callus becomes infected, symptoms may include:  Redness and swelling that gets worse.  Pain.  Fluid, blood, or pus draining from the corn or callus. How is this diagnosed? Corns and calluses may be diagnosed based on your symptoms, your medical history, and a physical exam. How is this treated? Treatment for corns and calluses may include:  Removing the cause of the friction or pressure. This may involve: ? Changing your shoes. ? Wearing shoe inserts (orthotics) or other protective layers in your shoes, such as a corn pad. ? Wearing gloves.  Applying medicine to the skin (topical medicine) to help soften skin in the hardened, thickened areas.  Removing layers of dead skin with a file to reduce the size of the corn or callus.  Removing the corn or callus with a scalpel or laser.  Taking antibiotic medicines, if your corn or callus is infected.  Having surgery, if a toe deformity is the cause. Follow these instructions at home:   Take over-the-counter and prescription medicines only as told by your health care provider.  If you were prescribed an antibiotic, take it as told by your health care provider. Do not stop taking it even if your condition starts to improve.  Wear shoes that fit well. Avoid wearing high-heeled shoes and shoes that are too tight or too loose.  Wear any padding, protective layers, gloves, or orthotics as told by your health care provider.  Soak your hands or feet and then use a file or pumice stone to soften your corn or callus. Do this as told by your health care provider.  Check your corn or callus every day for symptoms of infection. Contact a health care provider if you:  Notice that your symptoms do not improve with treatment.  Have redness or swelling that gets worse.  Notice that your corn or callus becomes painful.  Have fluid, blood, or pus coming from your corn or callus.  Have new symptoms. Summary  Corns are  small areas of thickened skin that occur on the top, sides, or tip of a toe.  Calluses are areas of thickened skin that can occur anywhere on the body, including the hands, fingers, palms, and soles of the feet. Calluses are usually larger than corns.  Corns and calluses are caused by rubbing (friction) or pressure, such as from shoes that are too tight or do not fit properly.  Treatment may include wearing any padding, protective layers, gloves, or orthotics as told by your health care provider. This information is not intended to replace advice given to you by your health care provider. Make sure you discuss any questions you have with your health care provider. Document Released: 04/29/2004 Document Revised: 06/06/2017 Document Reviewed: 06/06/2017 Elsevier Interactive Patient Education  2019 [employer]  

## 2018-08-09 NOTE — Progress Notes (Signed)
Subjective: Morgan [name] presents for follow up preventative diabetic foot care on today. She voices no new problems on today's visit. Morgan [name] states she did purchase the Eucerin Intensive Repair Lotion  and she really likes it and has noticed an improvement in the appearance in her feet.  She continues to wear Skechers shoes with memory foam insoles daily. She is not interested in diabetic shoes at this time.  Objective:  Vascular Examination: Capillary refill time  immediate x 10 digits Dorsalis pedis pulses present b/l Posterior tibial pulses absent b/l Varicosities BLE No digital hair x 10 digits Skin temperature warm to warm b/l  Dermatological Examination: Skin atrophy b/l  Toenails 1-5 b/l discolored, thick, dystrophic with subungual debris and pain with palpation to nailbeds due to thickness of nails.  Hyperkeratotic lesions submet head 1 left foot, submet head 1,2, 3 right foot, distal tip right 4th digit,   Resolved hyperkeratosis distal tip left 3rd and 4th toes  No open wounds.   No interdigital macerations.  Musculoskeletal: Muscle strength 5/5 to all LE muscle groups Hammertoe deformity 2-5 b/l HAV with bunion b/l  Neurological: Sensation diminished with 10 gram monofilament. Vibratory sensation diminished  Assessment: 1. Painful onychomycosis toenails 1-5 b/l 2. Calluses x 4 submet head 1 left foot, submet head 1,2, 3 right foot 3. Corns x 3 distal tip right 4th digit, resolved corns distal tip left 3rd and 4th toes 4. NIDDM with Diabetic neuropathy  Plan: 1. Continue diabetic foot care principles.  2. Toenails 1-5 b/l were debrided in length and girth without iatrogenic bleeding. 3.  Hyperkeratotic lesions pared with sterile chisel blade (Calluses x 4 submet head 1 left foot, submet head 1,2, 3 right foot;  Corn x 1 distal tip right 4th digit. 4. Continue Eucerin Intensive Repair Lotion for daily moisturizing 5. Patient to continue soft, supportive  shoe gear 6. Patient to report any pedal injuries to medical professional  7. Follow up 3 months. Patient/POA to call should there be a concern in the interim.

## 2018-11-08 ENCOUNTER — Ambulatory Visit: Payer: Medicare Other | Admitting: Podiatry

## 2019-02-17 ENCOUNTER — Encounter: Payer: Self-pay | Admitting: Podiatry

## 2019-02-17 ENCOUNTER — Encounter

## 2019-02-17 ENCOUNTER — Ambulatory Visit (INDEPENDENT_AMBULATORY_CARE_PROVIDER_SITE_OTHER): Payer: Medicare Other | Admitting: Podiatry

## 2019-02-17 ENCOUNTER — Other Ambulatory Visit: Payer: Self-pay

## 2019-02-17 DIAGNOSIS — E1142 Type 2 diabetes mellitus with diabetic polyneuropathy: Secondary | ICD-10-CM | POA: Diagnosis not present

## 2019-02-17 DIAGNOSIS — L84 Corns and callosities: Secondary | ICD-10-CM

## 2019-02-17 DIAGNOSIS — M79675 Pain in left toe(s): Secondary | ICD-10-CM | POA: Diagnosis not present

## 2019-02-17 DIAGNOSIS — M79674 Pain in right toe(s): Secondary | ICD-10-CM

## 2019-02-17 DIAGNOSIS — B351 Tinea unguium: Secondary | ICD-10-CM

## 2019-02-17 NOTE — Patient Instructions (Signed)

## 2019-02-20 NOTE — Progress Notes (Signed)
Subjective: Morgan [name] presents with diabetes, diabetic neuropathy for follow up of painful, discolored, thick toenails and painful corns and calluses which interfere with activities of daily living. Pain is aggravated when wearing enclosed shoe gear. Pain is relieved with periodic professional debridement.  Current Outpatient Medications:  .  Calcium Carb-Cholecalciferol (CALCIUM-VITAMIN D) 600-400 MG-UNIT TABS, Take 1-2 tablets by mouth 2 (two) times daily. 1 tablet in the morning, 2 tablets in the evening, Disp: , Rfl:  .  clopidogrel (PLAVIX) 75 MG tablet, Take 75 mg by mouth daily.  , Disp: , Rfl:  .  fluticasone (FLONASE) 50 MCG/ACT nasal spray, Place 1 spray into both nostrils daily., Disp: , Rfl:  .  HYDROcodone-acetaminophen (NORCO) 7.5-325 MG tablet, Take 1 tablet by mouth every 6 (six) hours as needed for moderate pain., Disp: 10 tablet, Rfl: 0 .  levothyroxine (SYNTHROID, LEVOTHROID) 88 MCG tablet, Take 88 mcg by mouth daily.  , Disp: , Rfl:  .  losartan (COZAAR) 50 MG tablet, Take 50 mg by mouth daily., Disp: , Rfl:  .  metoprolol tartrate (LOPRESSOR) 25 MG tablet, Take 12.5 mg by mouth 2 (two) times daily. , Disp: , Rfl:  .  Multiple Vitamin (MULTIVITAMIN) tablet, Take 1 tablet by mouth daily., Disp: , Rfl:  .  promethazine (PHENERGAN) 25 MG suppository, Place 1 suppository (25 mg total) rectally every 6 (six) hours as needed for nausea or vomiting., Disp: 12 suppository, Rfl: 1 .  simvastatin (ZOCOR) 10 MG tablet, Take 10 mg by mouth at bedtime., Disp: , Rfl:   Allergies  Allergen Reactions  . Other Swelling    Strawberries & chocolate ?- Facial swelling.  . Shrimp [Shellfish Allergy] Swelling    Shrimp & flounder-face swells on left side.    Objective: Vascular Examination: Capillary refill time immediate x 10 digits.  Dorsalis pedis pulses present b/l.  Posterior tibial pulses absent b/l.  Digital hair absent x 10 digits.  Skin temperature gradient WNL  b/l.  Dermatological Examination: Pedal skin thin and atrophic b/l.  Toenails 1-5 b/l discolored, thick, dystrophic with subungual debris and pain with palpation to nailbeds due to thickness of nails.  Hyperkeratotic lesion(s) submet head 1 b/l, submet head 2, 3, right foot, b/l hallux and 5th digit PIPJ b/l. No erythema, no edema, no drainage, no flocculence noted.   No open wounds b/l.  Musculoskeletal: Muscle strength 5/5 to all LE muscle groups.  Hammertoes lesser digits b/l.  HAV with bunion b/l.  No pain, crepitus or joint limitation with passive/active ROM.  Neurological: Sensation diminished with 10 gram monofilament.  Vibratory sensation diminished  Assessment: 1. Painful onychomycosis toenails 1-5 b/l 2. Calluses submet head 1 b/l, submet head 2, 3, right foot, b/l hallux 3. Corns b/l 5th digits 4. NIDDM with Diabetic neuropathy  Plan: 1. Continue diabetic foot care principles. Literature dispensed on today. 2. Toenails 1-5 b/l were debrided in length and girth without iatrogenic bleeding. 3. Calluses pared submet head 1 b/l, submet head 2, 3, right foot, b/l hallux utilizing sterile scalpel blade without incident. Corn(s) pared b/l 5th digits utilizing sterile scalpel blade without incident.  4. Patient to continue soft, supportive shoe gear 5. Patient to report any pedal injuries to medical professional  6. Follow up 3 months.  7. Patient/POA to call should there be a concern in the interim.

## 2019-05-21 ENCOUNTER — Ambulatory Visit: Payer: Medicare Other | Admitting: Podiatry

## 2019-06-20 ENCOUNTER — Other Ambulatory Visit: Payer: Self-pay

## 2019-06-20 ENCOUNTER — Ambulatory Visit (INDEPENDENT_AMBULATORY_CARE_PROVIDER_SITE_OTHER): Payer: Medicare Other | Admitting: Podiatry

## 2019-06-20 ENCOUNTER — Encounter: Payer: Self-pay | Admitting: Podiatry

## 2019-06-20 DIAGNOSIS — M2011 Hallux valgus (acquired), right foot: Secondary | ICD-10-CM

## 2019-06-20 DIAGNOSIS — M79675 Pain in left toe(s): Secondary | ICD-10-CM | POA: Diagnosis not present

## 2019-06-20 DIAGNOSIS — B351 Tinea unguium: Secondary | ICD-10-CM | POA: Diagnosis not present

## 2019-06-20 DIAGNOSIS — M79674 Pain in right toe(s): Secondary | ICD-10-CM

## 2019-06-20 DIAGNOSIS — M2012 Hallux valgus (acquired), left foot: Secondary | ICD-10-CM

## 2019-06-20 DIAGNOSIS — E1142 Type 2 diabetes mellitus with diabetic polyneuropathy: Secondary | ICD-10-CM

## 2019-06-20 NOTE — Progress Notes (Addendum)
Complaint:  Visit Type: Patient returns to my office for continued preventative foot care services. Complaint: Patient states" my nails have grown long and thick and become painful to walk and wear shoes"   The patient presents for preventative foot care services.  Patient is taking plavix.  Podiatric Exam: Vascular: dorsalis pedis  are palpable bilateral.  Posterior tibial pulses are absent  B/L. Capillary return is immediate. Temperature gradient is WNL. Skin turgor WNL  Sensorium: Normal Semmes Weinstein monofilament test. Normal tactile sensation bilaterally. Nail Exam: Pt has thick disfigured discolored nails with subungual debris noted bilateral entire nail hallux through fifth toenails Ulcer Exam: There is no evidence of ulcer or pre-ulcerative changes or infection. Orthopedic Exam: Muscle tone and strength are WNL. No limitations in general ROM. No crepitus or effusions noted. Foot type and digits show no abnormalities. HAV  B/L. Skin: No Porokeratosis. No infection or ulcers  Diagnosis:  Onychomycosis, , Pain in right toe, pain in left toes  Treatment & Plan Procedures and Treatment: Consent by patient was obtained for treatment procedures.   Debridement of mycotic and hypertrophic toenails, 1 through 5 bilateral and clearing of subungual debris. No ulceration, no infection noted.  Return Visit-Office Procedure: Patient instructed to return to the office for a follow up visit 3 months for continued evaluation and treatment.    Gardiner Barefoot DPM

## 2019-09-22 ENCOUNTER — Ambulatory Visit (INDEPENDENT_AMBULATORY_CARE_PROVIDER_SITE_OTHER): Payer: Medicare Other | Admitting: Podiatry

## 2019-09-22 ENCOUNTER — Other Ambulatory Visit: Payer: Self-pay

## 2019-09-22 ENCOUNTER — Encounter: Payer: Self-pay | Admitting: Podiatry

## 2019-09-22 DIAGNOSIS — M2012 Hallux valgus (acquired), left foot: Secondary | ICD-10-CM

## 2019-09-22 DIAGNOSIS — M2041 Other hammer toe(s) (acquired), right foot: Secondary | ICD-10-CM

## 2019-09-22 DIAGNOSIS — M79674 Pain in right toe(s): Secondary | ICD-10-CM

## 2019-09-22 DIAGNOSIS — M2011 Hallux valgus (acquired), right foot: Secondary | ICD-10-CM

## 2019-09-22 DIAGNOSIS — L84 Corns and callosities: Secondary | ICD-10-CM | POA: Diagnosis not present

## 2019-09-22 DIAGNOSIS — E1151 Type 2 diabetes mellitus with diabetic peripheral angiopathy without gangrene: Secondary | ICD-10-CM | POA: Diagnosis not present

## 2019-09-22 DIAGNOSIS — M79675 Pain in left toe(s): Secondary | ICD-10-CM

## 2019-09-22 DIAGNOSIS — E119 Type 2 diabetes mellitus without complications: Secondary | ICD-10-CM

## 2019-09-22 DIAGNOSIS — M2042 Other hammer toe(s) (acquired), left foot: Secondary | ICD-10-CM

## 2019-09-22 DIAGNOSIS — B351 Tinea unguium: Secondary | ICD-10-CM | POA: Diagnosis not present

## 2019-09-22 NOTE — Patient Instructions (Signed)

## 2019-09-22 NOTE — Progress Notes (Signed)
Subjective: Morgan [name] presents today for follow up of preventative diabetic foot care and painful corn(s) right foot and callus(es) b/l feet and painful mycotic nails b/l.  Pain interferes with ambulation. Aggravating factors include wearing enclosed shoe gear..   Allergies  Allergen Reactions  . Other Swelling    Strawberries & chocolate ?- Facial swelling.  . Shrimp [Shellfish Allergy] Swelling    Shrimp & flounder-face swells on left side.     Objective: There were no vitals filed for this visit.  Vascular Examination:  Capillary refill time to digits immediate b/l, palpable DP pulses b/l, non-palpable PT pulses b/l, pedal hair absent b/l and skin temperature gradient within normal limits b/l  Dermatological Examination: Pedal skin is thin shiny, atrophic bilaterally, no open wounds bilaterally, no interdigital macerations bilaterally, toenails 1-5 b/l elongated, dystrophic, thickened, crumbly with subungual debris and hyperkeratotic lesion(s) dorsal PIPJ right 5th digit, distal tip right 3rd/4th digits and 1st metatarsal heads b/l.  No erythema, no edema, no drainage, no flocculence  Musculoskeletal: Normal muscle strength 5/5 to all lower extremity muscle groups bilaterally, no pain crepitus or joint limitation noted with ROM b/l, bunion deformity noted b/l and hammertoes noted to the  2-5 bilaterally  Neurological: Protective sensation intact 5/5 intact bilaterally with 10g monofilament b/l and vibratory sensation decreased b/l  Assessment: 1. Pain due to onychomycosis of toenails of both feet   2. Hallux valgus, bilateral   3. Acquired hammertoes of both feet   4. Corns and callus   5. Type II diabetes mellitus with peripheral circulatory disorder (HCC)     Plan: -Continue diabetic foot care principles. Literature dispensed on today.  -Toenails 1-5 b/l were debrided in length and girth without iatrogenic bleeding. -corns and calluses were debrided without  complication or incident. Total number debrided =5 digits 3, 4, 5 right foot and 1st metatarsal heads b/l -Patient to continue soft, supportive shoe gear daily. -Patient to report any pedal injuries to medical professional immediately. -Patient/POA to call should there be question/concern in the interim.  Return in about 3 months (around 12/20/2019) for nail and callus trim.

## 2019-12-22 ENCOUNTER — Encounter: Payer: Self-pay | Admitting: Podiatry

## 2019-12-22 ENCOUNTER — Ambulatory Visit (INDEPENDENT_AMBULATORY_CARE_PROVIDER_SITE_OTHER): Payer: Medicare Other | Admitting: Podiatry

## 2019-12-22 ENCOUNTER — Other Ambulatory Visit: Payer: Self-pay

## 2019-12-22 VITALS — Temp 97.9°F

## 2019-12-22 DIAGNOSIS — E1151 Type 2 diabetes mellitus with diabetic peripheral angiopathy without gangrene: Secondary | ICD-10-CM

## 2019-12-22 DIAGNOSIS — L84 Corns and callosities: Secondary | ICD-10-CM

## 2019-12-22 DIAGNOSIS — M79674 Pain in right toe(s): Secondary | ICD-10-CM

## 2019-12-22 DIAGNOSIS — M79675 Pain in left toe(s): Secondary | ICD-10-CM

## 2019-12-22 DIAGNOSIS — B351 Tinea unguium: Secondary | ICD-10-CM

## 2019-12-22 NOTE — Patient Instructions (Signed)
Diabetes Mellitus and Foot Care Foot care is an important part of your health, especially when you have diabetes. Diabetes may cause you to have problems because of poor blood flow (circulation) to your feet and legs, which can cause your skin to:  Become thinner and drier.  Break more easily.  Heal more slowly.  Peel and crack. You may also have nerve damage (neuropathy) in your legs and feet, causing decreased feeling in them. This means that you may not notice minor injuries to your feet that could lead to more serious problems. Noticing and addressing any potential problems early is the best way to prevent future foot problems. How to care for your feet Foot hygiene  Wash your feet daily with warm water and mild soap. Do not use hot water. Then, pat your feet and the areas between your toes until they are completely dry. Do not soak your feet as this can dry your skin.  Trim your toenails straight across. Do not dig under them or around the cuticle. File the edges of your nails with an emery board or nail file.  Apply a moisturizing lotion or petroleum jelly to the skin on your feet and to dry, brittle toenails. Use lotion that does not contain alcohol and is unscented. Do not apply lotion between your toes. Shoes and socks  Wear clean socks or stockings every day. Make sure they are not too tight. Do not wear knee-high stockings since they may decrease blood flow to your legs.  Wear shoes that fit properly and have enough cushioning. Always look in your shoes before you put them on to be sure there are no objects inside.  To break in new shoes, wear them for just a few hours a day. This prevents injuries on your feet. Wounds, scrapes, corns, and calluses  Check your feet daily for blisters, cuts, bruises, sores, and redness. If you cannot see the bottom of your feet, use a mirror or ask someone for help.  Do not cut corns or calluses or try to remove them with medicine.  If you  find a minor scrape, cut, or break in the skin on your feet, keep it and the skin around it clean and dry. You may clean these areas with mild soap and water. Do not clean the area with peroxide, alcohol, or iodine.  If you have a wound, scrape, corn, or callus on your foot, look at it several times a day to make sure it is healing and not infected. Check for: ? Redness, swelling, or pain. ? Fluid or blood. ? Warmth. ? Pus or a bad smell. General instructions  Do not cross your legs. This may decrease blood flow to your feet.  Do not use heating pads or hot water bottles on your feet. They may burn your skin. If you have lost feeling in your feet or legs, you may not know this is happening until it is too late.  Protect your feet from hot and cold by wearing shoes, such as at the beach or on hot pavement.  Schedule a complete foot exam at least once a year (annually) or more often if you have foot problems. If you have foot problems, report any cuts, sores, or bruises to your health care provider immediately. Contact a health care provider if:  You have a medical condition that increases your risk of infection and you have any cuts, sores, or bruises on your feet.  You have an injury that is not   healing.  You have redness on your legs or feet.  You feel burning or tingling in your legs or feet.  You have pain or cramps in your legs and feet.  Your legs or feet are numb.  Your feet always feel cold.  You have pain around a toenail. Get help right away if:  You have a wound, scrape, corn, or callus on your foot and: ? You have pain, swelling, or redness that gets worse. ? You have fluid or blood coming from the wound, scrape, corn, or callus. ? Your wound, scrape, corn, or callus feels warm to the touch. ? You have pus or a bad smell coming from the wound, scrape, corn, or callus. ? You have a fever. ? You have a red line going up your leg. Summary  Check your feet every day  for cuts, sores, red spots, swelling, and blisters.  Moisturize feet and legs daily.  Wear shoes that fit properly and have enough cushioning.  If you have foot problems, report any cuts, sores, or bruises to your health care provider immediately.  Schedule a complete foot exam at least once a year (annually) or more often if you have foot problems. This information is not intended to replace advice given to you by your health care provider. Make sure you discuss any questions you have with your health care provider. Document Revised: 04/16/2019 Document Reviewed: 08/25/2016 Elsevier Patient Education  2020 Elsevier Inc.  Corns and Calluses Corns are small areas of thickened skin that occur on the top, sides, or tip of a toe. They contain a cone-shaped core with a point that can press on a nerve below. This causes pain.  Calluses are areas of thickened skin that can occur anywhere on the body, including the hands, fingers, palms, soles of the feet, and heels. Calluses are usually larger than corns. What are the causes? Corns and calluses are caused by rubbing (friction) or pressure, such as from shoes that are too tight or do not fit properly. What increases the risk? Corns are more likely to develop in people who have misshapen toes (toe deformities), such as hammer toes. Calluses can occur with friction to any area of the skin. They are more likely to develop in people who:  Work with their hands.  Wear shoes that fit poorly, are too tight, or are high-heeled.  Have toe deformities. What are the signs or symptoms? Symptoms of a corn or callus include:  A hard growth on the skin.  Pain or tenderness under the skin.  Redness and swelling.  Increased discomfort while wearing tight-fitting shoes, if your feet are affected. If a corn or callus becomes infected, symptoms may include:  Redness and swelling that gets worse.  Pain.  Fluid, blood, or pus draining from the corn or  callus. How is this diagnosed? Corns and calluses may be diagnosed based on your symptoms, your medical history, and a physical exam. How is this treated? Treatment for corns and calluses may include:  Removing the cause of the friction or pressure. This may involve: ? Changing your shoes. ? Wearing shoe inserts (orthotics) or other protective layers in your shoes, such as a corn pad. ? Wearing gloves.  Applying medicine to the skin (topical medicine) to help soften skin in the hardened, thickened areas.  Removing layers of dead skin with a file to reduce the size of the corn or callus.  Removing the corn or callus with a scalpel or laser.  Taking   antibiotic medicines, if your corn or callus is infected.  Having surgery, if a toe deformity is the cause. Follow these instructions at home:   Take over-the-counter and prescription medicines only as told by your health care provider.  If you were prescribed an antibiotic, take it as told by your health care provider. Do not stop taking it even if your condition starts to improve.  Wear shoes that fit well. Avoid wearing high-heeled shoes and shoes that are too tight or too loose.  Wear any padding, protective layers, gloves, or orthotics as told by your health care provider.  Soak your hands or feet and then use a file or pumice stone to soften your corn or callus. Do this as told by your health care provider.  Check your corn or callus every day for symptoms of infection. Contact a health care provider if you:  Notice that your symptoms do not improve with treatment.  Have redness or swelling that gets worse.  Notice that your corn or callus becomes painful.  Have fluid, blood, or pus coming from your corn or callus.  Have new symptoms. Summary  Corns are small areas of thickened skin that occur on the top, sides, or tip of a toe.  Calluses are areas of thickened skin that can occur anywhere on the body, including the  hands, fingers, palms, and soles of the feet. Calluses are usually larger than corns.  Corns and calluses are caused by rubbing (friction) or pressure, such as from shoes that are too tight or do not fit properly.  Treatment may include wearing any padding, protective layers, gloves, or orthotics as told by your health care provider. This information is not intended to replace advice given to you by your health care provider. Make sure you discuss any questions you have with your health care provider. Document Revised: 11/13/2018 Document Reviewed: 06/06/2017 Elsevier Patient Education  2020 Elsevier Inc.  

## 2019-12-28 NOTE — Progress Notes (Signed)
Subjective: Morgan [name] presents today at risk foot care. Pt has h/o NIDDM with PAD and corn(s) right foot and callus(es) b/l feet and painful mycotic nails b/l.  Pain interferes with ambulation. Aggravating factors include wearing enclosed shoe gear.  She voices no new pedal problems on today's visit.  Past Medical History:  Diagnosis Date  . Arthritis   . Cancer [facility])    breast bilateral  . Complication of chemotherapy   . Diabetes mellitus without complication ([place])    medication stiopped 5 months ago  checks CBG 2 times monthly  . Dysrhythmia   . Hearing loss   . Hyperlipidemia   . Hypertension   . Hypothyroidism   . S/P radiation therapy   . Stroke [facility])    TIA  . Thyroid disease    hypothyroidism     Current Outpatient Medications on File Prior to Visit  Medication Sig Dispense Refill  . Calcium Carb-Cholecalciferol (CALCIUM-VITAMIN D) 600-400 MG-UNIT TABS Take 1-2 tablets by mouth 2 (two) times daily. 1 tablet in the morning, 2 tablets in the evening    . clopidogrel (PLAVIX) 75 MG tablet Take 75 mg by mouth daily.      . fluticasone (FLONASE) 50 MCG/ACT nasal spray Place 1 spray into both nostrils daily.    Marland Kitchen HYDROcodone-acetaminophen (NORCO) 7.5-325 MG tablet Take 1 tablet by mouth every 6 (six) hours as needed for moderate pain. 10 tablet 0  . levothyroxine (SYNTHROID, LEVOTHROID) 88 MCG tablet Take 88 mcg by mouth daily.      Marland Kitchen losartan (COZAAR) 50 MG tablet Take 50 mg by mouth daily.    . metoprolol tartrate (LOPRESSOR) 25 MG tablet Take 12.5 mg by mouth 2 (two) times daily.     . Multiple Vitamin (MULTIVITAMIN) tablet Take 1 tablet by mouth daily.    . promethazine (PHENERGAN) 25 MG suppository Place 1 suppository (25 mg total) rectally every 6 (six) hours as needed for nausea or vomiting. 12 suppository 1  . simvastatin (ZOCOR) 10 MG tablet Take 10 mg by mouth at bedtime.     No current facility-administered medications on file prior to visit.     Allergies   Allergen Reactions  . Other Swelling    Strawberries & chocolate ?- Facial swelling.  . Shrimp [Shellfish Allergy] Swelling    Shrimp & flounder-face swells on left side.    Objective: Morgan [name] is a pleasant 86 y.o. y.o. Patient Race: White or Caucasian [1]  female in NAD. AAO x 3.  Vitals:   12/22/19 0822  Temp: 97.9 F (36.6 C)  Vascular Examination: Neurovascular status unchanged b/l. Capillary refill time to digits immediate b/l. Palpable DP pulses b/l. Nonpalpable PT pulses b/l. Pedal hair absent b/l Skin temperature gradient within normal limits b/l.  Dermatological Examination: Pedal skin is thin shiny, atrophic bilaterally. No open wounds bilaterally. No interdigital macerations bilaterally. Toenails 1-5 b/l elongated, dystrophic, thickened, crumbly with subungual debris and tenderness to dorsal palpation. Hyperkeratotic lesion(s) R 3rd toe, R 4th toe, R 5th toe and 1st metatarsal head b/l.  No erythema, no edema, no drainage, no flocculence.  Musculoskeletal: Normal muscle strength 5/5 to all lower extremity muscle groups bilaterally. No pain crepitus or joint limitation noted with ROM b/l. Hallux valgus with bunion deformity noted b/l. Hammertoes noted to the 2-5 bilaterally.  Neurological Examination: Protective sensation intact 5/5 intact bilaterally with 10g monofilament b/l. Vibratory sensation decreased b/l.  Assessment: 1. Pain due to onychomycosis of toenails of both feet  2. Corns and callus   3. Type II diabetes mellitus with peripheral circulatory disorder (HCC)   Plan: -Examined patient. -No new findings. No new orders. -Continue diabetic foot care principles. Literature dispensed on today.  -Toenails 1-5 b/l were debrided in length and girth with sterile nail nippers and dremel without iatrogenic bleeding.  -Corn(s) R 3rd toe, R 4th toe and R 5th toe and callus(es) 1st metatarsal head b/l were pared utilizing sterile scalpel blade without incident.  Total number debrided =5. -Patient to continue soft, supportive shoe gear daily. -Patient to report any pedal injuries to medical professional immediately. -Patient/POA to call should there be question/concern in the interim.  Return in about 3 months (around 03/23/2020) for diabetic nail and callus trim.  Marzetta Board, DPM

## 2020-03-23 ENCOUNTER — Other Ambulatory Visit: Payer: Self-pay

## 2020-03-23 ENCOUNTER — Ambulatory Visit (INDEPENDENT_AMBULATORY_CARE_PROVIDER_SITE_OTHER): Payer: Medicare Other | Admitting: Podiatry

## 2020-03-23 ENCOUNTER — Encounter: Payer: Self-pay | Admitting: Podiatry

## 2020-03-23 DIAGNOSIS — M79674 Pain in right toe(s): Secondary | ICD-10-CM

## 2020-03-23 DIAGNOSIS — M79675 Pain in left toe(s): Secondary | ICD-10-CM | POA: Diagnosis not present

## 2020-03-23 DIAGNOSIS — L84 Corns and callosities: Secondary | ICD-10-CM | POA: Diagnosis not present

## 2020-03-23 DIAGNOSIS — B351 Tinea unguium: Secondary | ICD-10-CM | POA: Diagnosis not present

## 2020-03-23 DIAGNOSIS — E1151 Type 2 diabetes mellitus with diabetic peripheral angiopathy without gangrene: Secondary | ICD-10-CM | POA: Diagnosis not present

## 2020-03-25 NOTE — Progress Notes (Signed)
Subjective: Morgan [name] presents today at risk foot care. Pt has h/o NIDDM with PAD and corn(s) right foot and callus(es) b/l feet and painful mycotic nails b/l.  Pain interferes with ambulation. Aggravating factors include wearing enclosed shoe gear.  Morgan [name] voices no new pedal problems on today's visit.  Past Medical History:  Diagnosis Date  . Arthritis   . Cancer [facility])    breast bilateral  . Complication of chemotherapy   . Diabetes mellitus without complication ([place])    medication stiopped 5 months ago  checks CBG 2 times monthly  . Dysrhythmia   . Hearing loss   . Hyperlipidemia   . Hypertension   . Hypothyroidism   . S/P radiation therapy   . Stroke [facility])    TIA  . Thyroid disease    hypothyroidism     Current Outpatient Medications on File Prior to Visit  Medication Sig Dispense Refill  . Calcium Carb-Cholecalciferol (CALCIUM-VITAMIN D) 600-400 MG-UNIT TABS Take 1-2 tablets by mouth 2 (two) times daily. 1 tablet in the morning, 2 tablets in the evening    . clopidogrel (PLAVIX) 75 MG tablet Take 75 mg by mouth daily.      . fluticasone (FLONASE) 50 MCG/ACT nasal spray Place 1 spray into both nostrils daily.    Marland Kitchen HYDROcodone-acetaminophen (NORCO) 7.5-325 MG tablet Take 1 tablet by mouth every 6 (six) hours as needed for moderate pain. 10 tablet 0  . levothyroxine (SYNTHROID, LEVOTHROID) 88 MCG tablet Take 88 mcg by mouth daily.      Marland Kitchen losartan (COZAAR) 50 MG tablet Take 50 mg by mouth daily.    . metoprolol tartrate (LOPRESSOR) 25 MG tablet Take 12.5 mg by mouth 2 (two) times daily.     . Multiple Vitamin (MULTIVITAMIN) tablet Take 1 tablet by mouth daily.    . promethazine (PHENERGAN) 25 MG suppository Place 1 suppository (25 mg total) rectally every 6 (six) hours as needed for nausea or vomiting. 12 suppository 1  . simvastatin (ZOCOR) 10 MG tablet Take 10 mg by mouth at bedtime.     No current facility-administered medications on file prior to visit.      Allergies  Allergen Reactions  . Other Swelling    Strawberries & chocolate ?- Facial swelling.  . Shrimp [Shellfish Allergy] Swelling    Shrimp & flounder-face swells on left side.    Objective: Morgan [name] is a pleasant 86 y.o. Caucasian female in NAD. AAO x 3.  There were no vitals filed for this visit.   Vascular Examination: Neurovascular status unchanged b/l. Capillary refill time to digits immediate b/l. Palpable DP pulses b/l. Nonpalpable PT pulses b/l. Pedal hair absent b/l Skin temperature gradient within normal limits b/l.  Dermatological Examination: Pedal skin is thin shiny, atrophic bilaterally. No open wounds bilaterally. No interdigital macerations bilaterally. Toenails 1-5 b/l elongated, dystrophic, thickened, crumbly with subungual debris and tenderness to dorsal palpation. Hyperkeratotic lesion(s) R 3rd toe, R 4th toe, R 5th toe and 1st metatarsal head b/l.  No erythema, no edema, no drainage, no flocculence.  Musculoskeletal: Normal muscle strength 5/5 to all lower extremity muscle groups bilaterally. No pain crepitus or joint limitation noted with ROM b/l. Hallux valgus with bunion deformity noted b/l. Hammertoes noted to the 2-5 bilaterally.  Neurological Examination: Protective sensation intact 5/5 intact bilaterally with 10g monofilament b/l. Vibratory sensation decreased b/l.  Assessment: 1. Pain due to onychomycosis of toenails of both feet   2. Corns and callus   3.  Type II diabetes mellitus with peripheral circulatory disorder (HCC)     Plan: -Examined patient. -No new findings. No new orders. -Continue diabetic foot care principles. Literature dispensed on today.  -Toenails 1-5 b/l were debrided in length and girth with sterile nail nippers and dremel without iatrogenic bleeding.  -Corn(s) R 3rd toe, R 4th toe and R 5th toe and callus(es) 1st metatarsal head b/l were pared utilizing sterile scalpel blade without incident. Total number debrided  =5. -Patient to continue soft, supportive shoe gear daily. -Patient to report any pedal injuries to medical professional immediately. -Patient/POA to call should there be question/concern in the interim.  Return in about 3 months (around 06/23/2020) for diabetic nail and callus trim.  Marzetta Board, DPM

## 2020-07-12 ENCOUNTER — Encounter: Payer: Self-pay | Admitting: Podiatry

## 2020-07-12 ENCOUNTER — Ambulatory Visit (INDEPENDENT_AMBULATORY_CARE_PROVIDER_SITE_OTHER): Payer: Medicare Other | Admitting: Podiatry

## 2020-07-12 ENCOUNTER — Other Ambulatory Visit: Payer: Self-pay

## 2020-07-12 DIAGNOSIS — B351 Tinea unguium: Secondary | ICD-10-CM

## 2020-07-12 DIAGNOSIS — E1151 Type 2 diabetes mellitus with diabetic peripheral angiopathy without gangrene: Secondary | ICD-10-CM | POA: Diagnosis not present

## 2020-07-12 DIAGNOSIS — L84 Corns and callosities: Secondary | ICD-10-CM | POA: Diagnosis not present

## 2020-07-12 DIAGNOSIS — M79675 Pain in left toe(s): Secondary | ICD-10-CM | POA: Diagnosis not present

## 2020-07-12 DIAGNOSIS — M79674 Pain in right toe(s): Secondary | ICD-10-CM

## 2020-07-12 NOTE — Progress Notes (Signed)
Subjective:  Patient ID: Morgan [name], female    DOB: 02-08-1929,  MRN: [id number]  86 y.o. female presents with at risk foot care. Pt has h/o NIDDM with PAD and corn(s) b/l , callus(es) right foot and painful mycotic nails.  Pain interferes with ambulation. Aggravating factors include wearing enclosed shoe gear. Painful toenails interfere with ambulation. Aggravating factors include wearing enclosed shoe gear. Pain is relieved with periodic professional debridement. Painful corns and calluses are aggravated when weightbearing with and without shoegear. Pain is relieved with periodic professional debridement.  She states she missed her last appointment and had to reschedule.  She voices no new pedal problems on today's visit.  PCP: Deland Pretty, MD and last visit was: November, 2021.  Review of Systems: Negative except as noted in the HPI.  Past Medical History:  Diagnosis Date  . Arthritis   . Cancer [facility])    breast bilateral  . Complication of chemotherapy   . Diabetes mellitus without complication ([place])    medication stiopped 5 months ago  checks CBG 2 times monthly  . Dysrhythmia   . Hearing loss   . Hyperlipidemia   . Hypertension   . Hypothyroidism   . S/P radiation therapy   . Stroke [facility])    TIA  . Thyroid disease    hypothyroidism   Past Surgical History:  Procedure Laterality Date  . CHOLECYSTECTOMY  1978?  Marland Kitchen EYE SURGERY Bilateral    cataracts  . MASTECTOMY  1989/2005  . SQUAMOUS CELL CARCINOMA EXCISION    . ZENKER'S DIVERTICULECTOMY ENDOSCOPIC N/A 02/25/2016   Procedure: ENDOSCOPIC ZENKER'S  DIVERTICULECTOMY;  Surgeon: Izora Gala, MD;  Location: [place];  Service: ENT;  Laterality: N/A;   Patient Active Problem List   Diagnosis Date Noted  . Zenker's (hypopharyngeal) diverticulum 02/25/2016  . Pharyngoesophageal dysphagia 12/06/2015  . Anemia, unspecified 06/13/2013  . Osteopenia 04/18/2012  . History of breast cancer 12/11/2011    Current Outpatient  Medications:  .  Calcium Carb-Cholecalciferol (CALCIUM-VITAMIN D) 600-400 MG-UNIT TABS, Take 1-2 tablets by mouth 2 (two) times daily. 1 tablet in the morning, 2 tablets in the evening, Disp: , Rfl:  .  clopidogrel (PLAVIX) 75 MG tablet, Take 75 mg by mouth daily.  , Disp: , Rfl:  .  fluticasone (FLONASE) 50 MCG/ACT nasal spray, Place 1 spray into both nostrils daily., Disp: , Rfl:  .  HYDROcodone-acetaminophen (NORCO) 7.5-325 MG tablet, Take 1 tablet by mouth every 6 (six) hours as needed for moderate pain., Disp: 10 tablet, Rfl: 0 .  levothyroxine (SYNTHROID) 100 MCG tablet, Take 100 mcg by mouth daily., Disp: , Rfl:  .  levothyroxine (SYNTHROID, LEVOTHROID) 88 MCG tablet, Take 88 mcg by mouth daily.  , Disp: , Rfl:  .  losartan (COZAAR) 50 MG tablet, Take 50 mg by mouth daily., Disp: , Rfl:  .  metoprolol tartrate (LOPRESSOR) 25 MG tablet, Take 12.5 mg by mouth 2 (two) times daily. , Disp: , Rfl:  .  Multiple Vitamin (MULTIVITAMIN) tablet, Take 1 tablet by mouth daily., Disp: , Rfl:  .  promethazine (PHENERGAN) 25 MG suppository, Place 1 suppository (25 mg total) rectally every 6 (six) hours as needed for nausea or vomiting., Disp: 12 suppository, Rfl: 1 .  simvastatin (ZOCOR) 10 MG tablet, Take 10 mg by mouth at bedtime., Disp: , Rfl:  Allergies  Allergen Reactions  . Other Swelling    Strawberries & chocolate ?- Facial swelling.  . Shrimp [Shellfish Allergy] Swelling    Shrimp &  flounder-face swells on left side.   Social History   Tobacco Use  Smoking Status Never Smoker  Smokeless Tobacco Never Used    Objective:  There were no vitals filed for this visit. Constitutional Patient is a pleasant 86 y.o. Caucasian female WD, WN in NAD. AAO x 3.  Vascular Capillary refill time to digits immediate b/l. Palpable DP pulse(s) b/l lower extremities Nonpalpable PT pulse(s) b/l lower extremities. Pedal hair absent. Lower extremity skin temperature gradient within normal limits. No cyanosis or  clubbing noted.  Neurologic Normal speech. Protective sensation intact 5/5 intact bilaterally with 10g monofilament b/l. Vibratory sensation intact b/l.  Dermatologic Pedal skin with normal turgor, texture and tone bilaterally. No open wounds bilaterally. No interdigital macerations bilaterally. Toenails 1-5 b/l elongated, discolored, dystrophic, thickened, crumbly with subungual debris and tenderness to dorsal palpation. Hyperkeratotic lesion(s) submet head 1 right foot.  No erythema, no edema, no drainage, no fluctuance. Porokeratotic lesion(s) L 3rd toe and R 4th toe. No erythema, no edema, no drainage, no fluctuance.  Orthopedic: Normal muscle strength 5/5 to all lower extremity muscle groups bilaterally. No pain crepitus or joint limitation noted with ROM b/l. Hallux valgus with bunion deformity noted b/l lower extremities. Hammertoes noted to the 2-5 bilaterally.   No flowsheet data found.     Assessment:   1. Pain due to onychomycosis of toenails of both feet   2. Corns and callus   3. Type II diabetes mellitus with peripheral circulatory disorder [facility])    Plan:  Patient was evaluated and treated and all questions answered.  Onychomycosis with pain -Nails palliatively debridement as below. -Educated on self-care  Procedure: Nail Debridement Rationale: Pain Type of Debridement: manual, sharp debridement. Instrumentation: Nail nipper, rotary burr. Number of Nails: 10  -Examined patient. -No new findings. No new orders. -Continue diabetic foot care principles. -Patient to continue soft, supportive shoe gear daily. -Toenails 1-5 b/l were debrided in length and girth with sterile nail nippers and dremel without iatrogenic bleeding.  -Callus(es) submet head 1 right foot pared utilizing sterile scalpel blade without complication or incident. Total number debrided =1. -Painful porokeratotic lesion(s) L 3rd toe and R 4th toe pared and enucleated with sterile scalpel blade without  incident. -Patient to report any pedal injuries to medical professional immediately. -Patient/POA to call should there be question/concern in the interim.  Return in about 3 months (around 10/10/2020) for diabetic toenails, corn(s)/callus(es).  Marzetta Board, DPM

## 2020-10-11 ENCOUNTER — Ambulatory Visit (INDEPENDENT_AMBULATORY_CARE_PROVIDER_SITE_OTHER): Payer: Medicare Other | Admitting: Podiatry

## 2020-10-11 ENCOUNTER — Other Ambulatory Visit: Payer: Self-pay

## 2020-10-11 ENCOUNTER — Encounter: Payer: Self-pay | Admitting: Podiatry

## 2020-10-11 DIAGNOSIS — E1151 Type 2 diabetes mellitus with diabetic peripheral angiopathy without gangrene: Secondary | ICD-10-CM

## 2020-10-11 DIAGNOSIS — E039 Hypothyroidism, unspecified: Secondary | ICD-10-CM | POA: Insufficient documentation

## 2020-10-11 DIAGNOSIS — M79674 Pain in right toe(s): Secondary | ICD-10-CM

## 2020-10-11 DIAGNOSIS — B351 Tinea unguium: Secondary | ICD-10-CM

## 2020-10-11 DIAGNOSIS — E1121 Type 2 diabetes mellitus with diabetic nephropathy: Secondary | ICD-10-CM | POA: Insufficient documentation

## 2020-10-11 DIAGNOSIS — M79675 Pain in left toe(s): Secondary | ICD-10-CM

## 2020-10-11 DIAGNOSIS — L84 Corns and callosities: Secondary | ICD-10-CM | POA: Diagnosis not present

## 2020-10-11 DIAGNOSIS — M2011 Hallux valgus (acquired), right foot: Secondary | ICD-10-CM

## 2020-10-11 DIAGNOSIS — M2012 Hallux valgus (acquired), left foot: Secondary | ICD-10-CM

## 2020-10-11 DIAGNOSIS — M2042 Other hammer toe(s) (acquired), left foot: Secondary | ICD-10-CM

## 2020-10-11 DIAGNOSIS — J309 Allergic rhinitis, unspecified: Secondary | ICD-10-CM | POA: Insufficient documentation

## 2020-10-11 DIAGNOSIS — M40209 Unspecified kyphosis, site unspecified: Secondary | ICD-10-CM | POA: Insufficient documentation

## 2020-10-11 DIAGNOSIS — M2041 Other hammer toe(s) (acquired), right foot: Secondary | ICD-10-CM

## 2020-10-11 DIAGNOSIS — I129 Hypertensive chronic kidney disease with stage 1 through stage 4 chronic kidney disease, or unspecified chronic kidney disease: Secondary | ICD-10-CM | POA: Insufficient documentation

## 2020-10-11 DIAGNOSIS — Z8673 Personal history of transient ischemic attack (TIA), and cerebral infarction without residual deficits: Secondary | ICD-10-CM | POA: Insufficient documentation

## 2020-10-11 DIAGNOSIS — M1990 Unspecified osteoarthritis, unspecified site: Secondary | ICD-10-CM | POA: Insufficient documentation

## 2020-10-11 DIAGNOSIS — E785 Hyperlipidemia, unspecified: Secondary | ICD-10-CM | POA: Insufficient documentation

## 2020-10-11 NOTE — Progress Notes (Signed)
  Subjective:  Patient ID: Morgan [name], female    DOB: July 06, 1929,  MRN: [id number]  87 y.o. female presents with at risk foot care. Pt has h/o NIDDM with PAD and corn(s) b/l , callus(es) right foot and painful mycotic nails.  Pain interferes with ambulation. Aggravating factors include wearing enclosed shoe gear. Painful toenails interfere with ambulation. Aggravating factors include wearing enclosed shoe gear. Pain is relieved with periodic professional debridement. Painful corns and calluses are aggravated when weightbearing with and without shoegear. Pain is relieved with periodic professional debridement.  She voices no new pedal problems on today's visit.  PCP: Deland Pretty, MD and last visit was: 07/12/2020.  Review of Systems: Negative except as noted in the HPI.  Allergies  Allergen Reactions  . Chocolate     Other reaction(s): Unknown  . Other Swelling    Strawberries & chocolate ?- Facial swelling.  . Shrimp (Diagnostic)     Other reaction(s): Unknown  . Shrimp [Shellfish Allergy] Swelling    Shrimp & flounder-face swells on left side.  [place] (Diagnostic)     Other reaction(s): Unknown   Objective:  There were no vitals filed for this visit. Constitutional Patient is a pleasant 87 y.o. Caucasian female WD, WN in NAD. AAO x 3.  Vascular Capillary refill time to digits immediate b/l. Palpable DP pulse(s) b/l lower extremities Nonpalpable PT pulse(s) b/l lower extremities. Pedal hair absent. Lower extremity skin temperature gradient within normal limits. No cyanosis or clubbing noted.  Neurologic Normal speech. Protective sensation intact 5/5 intact bilaterally with 10g monofilament b/l. Vibratory sensation intact b/l.  Dermatologic Pedal skin with normal turgor, texture and tone bilaterally. No open wounds bilaterally. No interdigital macerations bilaterally. Toenails 1-5 b/l elongated, discolored, dystrophic, thickened, crumbly with subungual debris and tenderness to  dorsal palpation. Hyperkeratotic lesion(s) submet head 1 left foot.  No erythema, no edema, no drainage, no fluctuance. Porokeratotic lesion(s) L 3rd toe and R 4th toe. No erythema, no edema, no drainage, no fluctuance.  Orthopedic: Normal muscle strength 5/5 to all lower extremity muscle groups bilaterally. No pain crepitus or joint limitation noted with ROM b/l. Hallux valgus with bunion deformity noted b/l lower extremities. Hammertoes noted to the 2-5 bilaterally.    Assessment:   1. Pain due to onychomycosis of toenails of both feet   2. Corns and callus   3. Hallux valgus, bilateral   4. Acquired hammertoes of both feet   5. Type II diabetes mellitus with peripheral circulatory disorder [facility])    Plan:  Patient was evaluated and treated and all questions answered.  Onychomycosis with pain -Nails palliatively debridement as below. -Educated on self-care  Procedure: Nail Debridement Rationale: Pain Type of Debridement: manual, sharp debridement. Instrumentation: Nail nipper, rotary burr. Number of Nails: 10  -Examined patient. -No new findings. No new orders. -Continue diabetic foot care principles. -Patient to continue soft, supportive shoe gear daily. -Toenails 1-5 b/l were debrided in length and girth with sterile nail nippers and dremel without iatrogenic bleeding.  -Callus(es) submet head 1 left foot pared utilizing sterile scalpel blade without complication or incident. Total number debrided =1. -Painful porokeratotic lesion(s) L 3rd toe and R 4th toe pared and enucleated with sterile scalpel blade without incident. -Patient to report any pedal injuries to medical professional immediately. -Patient/POA to call should there be question/concern in the interim.  Return in about 3 months (around 01/11/2021).  Marzetta Board, DPM

## 2020-10-18 ENCOUNTER — Emergency Department (HOSPITAL_COMMUNITY): Payer: Medicare Other

## 2020-10-18 ENCOUNTER — Encounter (HOSPITAL_COMMUNITY): Payer: Self-pay | Admitting: Internal Medicine

## 2020-10-18 ENCOUNTER — Inpatient Hospital Stay (HOSPITAL_COMMUNITY)
Admission: EM | Admit: 2020-10-18 | Discharge: 2020-10-22 | DRG: 090 | Disposition: A | Discharge status: Skilled Nursing Facility | Payer: Medicare Other | Attending: Family Medicine | Admitting: Family Medicine

## 2020-10-18 ENCOUNTER — Other Ambulatory Visit: Payer: Self-pay

## 2020-10-18 DIAGNOSIS — Z803 Family history of malignant neoplasm of breast: Secondary | ICD-10-CM

## 2020-10-18 DIAGNOSIS — K449 Diaphragmatic hernia without obstruction or gangrene: Secondary | ICD-10-CM | POA: Diagnosis present

## 2020-10-18 DIAGNOSIS — Z7902 Long term (current) use of antithrombotics/antiplatelets: Secondary | ICD-10-CM

## 2020-10-18 DIAGNOSIS — N183 Chronic kidney disease, stage 3 unspecified: Secondary | ICD-10-CM | POA: Diagnosis present

## 2020-10-18 DIAGNOSIS — Z8249 Family history of ischemic heart disease and other diseases of the circulatory system: Secondary | ICD-10-CM

## 2020-10-18 DIAGNOSIS — E785 Hyperlipidemia, unspecified: Secondary | ICD-10-CM | POA: Diagnosis present

## 2020-10-18 DIAGNOSIS — Z23 Encounter for immunization: Secondary | ICD-10-CM

## 2020-10-18 DIAGNOSIS — Z9889 Other specified postprocedural states: Secondary | ICD-10-CM

## 2020-10-18 DIAGNOSIS — S0101XA Laceration without foreign body of scalp, initial encounter: Secondary | ICD-10-CM

## 2020-10-18 DIAGNOSIS — Y92009 Unspecified place in unspecified non-institutional (private) residence as the place of occurrence of the external cause: Secondary | ICD-10-CM

## 2020-10-18 DIAGNOSIS — Z853 Personal history of malignant neoplasm of breast: Secondary | ICD-10-CM

## 2020-10-18 DIAGNOSIS — N1832 Chronic kidney disease, stage 3b: Secondary | ICD-10-CM | POA: Diagnosis present

## 2020-10-18 DIAGNOSIS — I10 Essential (primary) hypertension: Secondary | ICD-10-CM | POA: Diagnosis present

## 2020-10-18 DIAGNOSIS — S060X0A Concussion without loss of consciousness, initial encounter: Principal | ICD-10-CM | POA: Diagnosis present

## 2020-10-18 DIAGNOSIS — S0990XA Unspecified injury of head, initial encounter: Secondary | ICD-10-CM | POA: Diagnosis not present

## 2020-10-18 DIAGNOSIS — Z20822 Contact with and (suspected) exposure to covid-19: Secondary | ICD-10-CM | POA: Diagnosis present

## 2020-10-18 DIAGNOSIS — E039 Hypothyroidism, unspecified: Secondary | ICD-10-CM | POA: Diagnosis present

## 2020-10-18 DIAGNOSIS — Z833 Family history of diabetes mellitus: Secondary | ICD-10-CM

## 2020-10-18 DIAGNOSIS — R262 Difficulty in walking, not elsewhere classified: Secondary | ICD-10-CM | POA: Diagnosis present

## 2020-10-18 DIAGNOSIS — Z8673 Personal history of transient ischemic attack (TIA), and cerebral infarction without residual deficits: Secondary | ICD-10-CM

## 2020-10-18 DIAGNOSIS — W19XXXA Unspecified fall, initial encounter: Secondary | ICD-10-CM | POA: Diagnosis not present

## 2020-10-18 DIAGNOSIS — Z8719 Personal history of other diseases of the digestive system: Secondary | ICD-10-CM

## 2020-10-18 DIAGNOSIS — Y92000 Kitchen of unspecified non-institutional (private) residence as  the place of occurrence of the external cause: Secondary | ICD-10-CM

## 2020-10-18 DIAGNOSIS — W010XXA Fall on same level from slipping, tripping and stumbling without subsequent striking against object, initial encounter: Secondary | ICD-10-CM | POA: Diagnosis present

## 2020-10-18 DIAGNOSIS — T189XXA Foreign body of alimentary tract, part unspecified, initial encounter: Secondary | ICD-10-CM

## 2020-10-18 DIAGNOSIS — I129 Hypertensive chronic kidney disease with stage 1 through stage 4 chronic kidney disease, or unspecified chronic kidney disease: Secondary | ICD-10-CM | POA: Diagnosis present

## 2020-10-18 HISTORY — DX: Personal history of transient ischemic attack (TIA), and cerebral infarction without residual deficits: Z86.73

## 2020-10-18 HISTORY — DX: Chronic kidney disease, stage 3 unspecified: N18.30

## 2020-10-18 HISTORY — DX: Hypothyroidism, unspecified: E03.9

## 2020-10-18 HISTORY — DX: Essential (primary) hypertension: I10

## 2020-10-18 HISTORY — DX: Hyperlipidemia, unspecified: E78.5

## 2020-10-18 LAB — BASIC METABOLIC PANEL
Anion gap: 9 (ref 5–15)
BUN: 22 mg/dL (ref 8–23)
CO2: 25 mmol/L (ref 22–32)
Calcium: 9.3 mg/dL (ref 8.9–10.3)
Chloride: 104 mmol/L (ref 98–111)
Creatinine, Ser: 1.16 mg/dL — ABNORMAL HIGH (ref 0.44–1.00)
GFR, Estimated: 45 mL/min — ABNORMAL LOW (ref >60)
Glucose, Bld: 133 mg/dL — ABNORMAL HIGH (ref 70–99)
Potassium: 4.4 mmol/L (ref 3.5–5.1)
Sodium: 138 mmol/L (ref 135–145)

## 2020-10-18 LAB — URINALYSIS, ROUTINE W REFLEX MICROSCOPIC
Bilirubin Urine: NEGATIVE
Glucose, UA: NEGATIVE mg/dL
Hgb urine dipstick: NEGATIVE
Ketones, ur: 5 mg/dL — AB
Leukocytes,Ua: NEGATIVE
Nitrite: NEGATIVE
Protein, ur: NEGATIVE mg/dL
Specific Gravity, Urine: 1.015 (ref 1.005–1.030)
pH: 5 (ref 5.0–8.0)

## 2020-10-18 LAB — CBC WITH DIFFERENTIAL/PLATELET
Abs Immature Granulocytes: 0.07 10*3/uL (ref 0.00–0.07)
Basophils Absolute: 0 10*3/uL (ref 0.0–0.1)
Basophils Relative: 0 %
Eosinophils Absolute: 0 10*3/uL (ref 0.0–0.5)
Eosinophils Relative: 0 %
HCT: 33.4 % — ABNORMAL LOW (ref 36.0–46.0)
Hemoglobin: 11.9 g/dL — ABNORMAL LOW (ref 12.0–15.0)
Immature Granulocytes: 0 %
Lymphocytes Relative: 6 %
Lymphs Abs: 1 10*3/uL (ref 0.7–4.0)
MCH: 31.9 pg (ref 26.0–34.0)
MCHC: 35.6 g/dL (ref 30.0–36.0)
MCV: 89.5 fL (ref 80.0–100.0)
Monocytes Absolute: 1 10*3/uL (ref 0.1–1.0)
Monocytes Relative: 7 %
Neutro Abs: 13.7 10*3/uL — ABNORMAL HIGH (ref 1.7–7.7)
Neutrophils Relative %: 87 %
Platelets: 207 10*3/uL (ref 150–400)
RBC: 3.73 MIL/uL — ABNORMAL LOW (ref 3.87–5.11)
RDW: 13.6 % (ref 11.5–15.5)
WBC: 15.8 10*3/uL — ABNORMAL HIGH (ref 4.0–10.5)
nRBC: 0 % (ref 0.0–0.2)

## 2020-10-18 LAB — RESP PANEL BY RT-PCR (FLU A&B, COVID) ARPGX2
Influenza A by PCR: NEGATIVE
Influenza B by PCR: NEGATIVE
SARS Coronavirus 2 by RT PCR: NEGATIVE

## 2020-10-18 MED ORDER — ACETAMINOPHEN 650 MG RE SUPP: 650.0000 mg | Freq: Four times a day (QID) | RECTAL | Status: DC | PRN

## 2020-10-18 MED ORDER — METOPROLOL TARTRATE 25 MG PO TABS
25.0000 mg | ORAL_TABLET | Freq: Every day | ORAL | Status: DC
  Administered 2020-10-19 – 2020-10-22 (×4): 25 mg via ORAL
  Filled 2020-10-18 (×4): qty 1

## 2020-10-18 MED ORDER — ACETAMINOPHEN 500 MG PO TABS: 1000.0000 mg | ORAL_TABLET | Freq: Four times a day (QID) | ORAL | Status: DC | PRN

## 2020-10-18 MED ORDER — HYDROCODONE-ACETAMINOPHEN 5-325 MG PO TABS
1.0000 | ORAL_TABLET | Freq: Once | ORAL | Status: AC
  Administered 2020-10-18: 1 via ORAL
  Filled 2020-10-18: qty 1

## 2020-10-18 MED ORDER — SENNOSIDES-DOCUSATE SODIUM 8.6-50 MG PO TABS: 1.0000 | ORAL_TABLET | Freq: Every evening | ORAL | Status: DC | PRN

## 2020-10-18 MED ORDER — LOSARTAN POTASSIUM 50 MG PO TABS
50.0000 mg | ORAL_TABLET | Freq: Every day | ORAL | Status: DC
  Administered 2020-10-19 – 2020-10-22 (×4): 50 mg via ORAL
  Filled 2020-10-18 (×4): qty 1

## 2020-10-18 MED ORDER — CEPHALEXIN 500 MG PO CAPS: 500.0000 mg | ORAL_CAPSULE | Freq: Three times a day (TID) | ORAL | 0 refills | Status: DC

## 2020-10-18 MED ORDER — ENOXAPARIN SODIUM 40 MG/0.4ML ~~LOC~~ SOLN: 40.0000 mg | SUBCUTANEOUS | Status: DC

## 2020-10-18 MED ORDER — LIDOCAINE HCL (PF) 1 % IJ SOLN
5.0000 mL | Freq: Once | INTRAMUSCULAR | Status: AC
  Administered 2020-10-18: 5 mL
  Filled 2020-10-18: qty 5

## 2020-10-18 MED ORDER — CLOPIDOGREL BISULFATE 75 MG PO TABS
75.0000 mg | ORAL_TABLET | Freq: Every day | ORAL | Status: DC
  Administered 2020-10-19 – 2020-10-22 (×4): 75 mg via ORAL
  Filled 2020-10-18 (×4): qty 1

## 2020-10-18 MED ORDER — HYDROCODONE-ACETAMINOPHEN 5-325 MG PO TABS
1.0000 | ORAL_TABLET | Freq: Four times a day (QID) | ORAL | Status: DC | PRN
  Administered 2020-10-19 – 2020-10-22 (×4): 1 via ORAL
  Filled 2020-10-18 (×4): qty 1

## 2020-10-18 MED ORDER — SIMVASTATIN 20 MG PO TABS
10.0000 mg | ORAL_TABLET | Freq: Every day | ORAL | Status: DC
  Administered 2020-10-19 – 2020-10-22 (×5): 10 mg via ORAL
  Filled 2020-10-18 (×5): qty 1

## 2020-10-18 MED ORDER — ONDANSETRON HCL 4 MG PO TABS: 4.0000 mg | ORAL_TABLET | Freq: Four times a day (QID) | ORAL | Status: DC | PRN

## 2020-10-18 MED ORDER — LEVOTHYROXINE SODIUM 100 MCG PO TABS
100.0000 ug | ORAL_TABLET | Freq: Every day | ORAL | Status: DC
  Administered 2020-10-19 – 2020-10-22 (×4): 100 ug via ORAL
  Filled 2020-10-18 (×4): qty 1

## 2020-10-18 MED ORDER — TETANUS-DIPHTH-ACELL PERTUSSIS 5-2.5-18.5 LF-MCG/0.5 IM SUSY
0.5000 mL | PREFILLED_SYRINGE | Freq: Once | INTRAMUSCULAR | Status: AC
  Administered 2020-10-18: 0.5 mL via INTRAMUSCULAR
  Filled 2020-10-18: qty 0.5

## 2020-10-18 MED ORDER — ONDANSETRON HCL 4 MG/2ML IJ SOLN: 4.0000 mg | Freq: Four times a day (QID) | INTRAMUSCULAR | Status: DC | PRN

## 2020-10-18 NOTE — Discharge Instructions (Addendum)
Keep your wound covered and dry for the next 48 hours.  You may wash gently with soap and water after 48 hours of past.  Return in 10 days for staple removal.  Return immediately if you have increased pain signs of infection such as redness purulent drainage or any additional concerns.

## 2020-10-18 NOTE — ED Notes (Signed)
Attempted to  Call report

## 2020-10-18 NOTE — ED Triage Notes (Signed)
Pt from home, slipped while backing up in her kitchen. Mechanical fall without dizziness or LOC. Did strike head, bleeding controlled and bandage applied by EMS. C collar in place. GCS 15.

## 2020-10-18 NOTE — Progress Notes (Signed)
Patient arrival to 5N23 from ED via stretcher. patient in no acute distress. Bed in lowest position andcall bell within reach

## 2020-10-18 NOTE — ED Notes (Signed)
Attempted to call report

## 2020-10-18 NOTE — Progress Notes (Signed)
Rt responded to level 2 trauma but is not needed at this time. Pt on RA, denies SOB, no increased WOB> RN to call if RT needed.

## 2020-10-18 NOTE — ED Provider Notes (Signed)
Patient seen after prior ED provider  Patient is complaining of significant low back lumbar spine pain.  Patient is unable to sit much less stand.  Patient administered pain medicine without significant improvement in her symptoms.  Plain film imaging of the lumbar spine and pelvis are without significant acute abnormality.  Case discussed with Dr. Sherrye Payor, hospitalist service, who requests completion of CT imaging of the thoracic and L-spine prior to admission.  Screening admission labs also to be obtained.   Valarie Merino, MD 10/18/20 1843

## 2020-10-18 NOTE — ED Notes (Signed)
Admitting provider bedside 

## 2020-10-18 NOTE — ED Provider Notes (Addendum)
[facility] EMERGENCY DEPARTMENT Provider Note   CSN: [id number] Arrival date & time: 10/18/20  1422     History No chief complaint on file.   Morgan [name] is a 87 y.o. female.  Patient presents ER as a trauma activation.  She states she lost her balance and fell backwards in the kitchen hitting her head on one of the cutting boards.  Denies pain elsewhere complaining of mild headache.  Denies back pain or extremity pain.  No hip pain.  There was notices no fever vomiting cough or diarrhea.  Reportedly is on Plavix.        No past medical history on file.  There are no problems to display for this patient.      OB History   No obstetric history on file.     No family history on file.     Home Medications Prior to Admission medications   Medication Sig Start Date End Date Taking? Authorizing Provider  cephALEXin (KEFLEX) 500 MG capsule Take 1 capsule (500 mg total) by mouth 3 (three) times daily for 3 days. 10/18/20 10/21/20 Yes Luna Fuse, MD    Allergies    Patient has no known allergies.  Review of Systems   Review of Systems  Constitutional: Negative for fever.  HENT: Negative for ear pain.   Eyes: Negative for pain.  Respiratory: Negative for cough.   Cardiovascular: Negative for chest pain.  Gastrointestinal: Negative for abdominal pain.  Genitourinary: Negative for flank pain.  Musculoskeletal: Negative for back pain.  Skin: Negative for rash.  Neurological: Positive for headaches.    Physical Exam Updated Vital Signs BP (!) 190/80   Pulse 73   Temp (!) 97.2 F (36.2 C) (Temporal)   Resp 16   Ht 5\' 2"  (1.575 m)   Wt 70.3 kg   SpO2 98%   BMI 28.35 kg/m   Physical Exam Constitutional:      General: She is not in acute distress.    Appearance: Normal appearance.  HENT:     Head: Normocephalic.     Comments: Patient has a 4 cm linear laceration on the top of her scalp.    Nose: Nose normal.  Eyes:      Extraocular Movements: Extraocular movements intact.  Cardiovascular:     Rate and Rhythm: Normal rate.  Pulmonary:     Effort: Pulmonary effort is normal.  Musculoskeletal:        General: Normal range of motion.     Cervical back: Normal range of motion.  Neurological:     General: No focal deficit present.     Mental Status: She is alert. Mental status is at baseline.     Comments: No cranial nerve deficit 2-12 intact.  5/5 strength all extremities.  No pain with range of motion of the hips or knees or ankles shoulders elbows or wrists.     ED Results / Procedures / Treatments   Labs (all labs ordered are listed, but only abnormal results are displayed) Labs Reviewed - No data to display  EKG None  Radiology DG Chest 1 View  Result Date: 10/18/2020 CLINICAL DATA:  Fall, no loss of consciousness EXAM: CHEST  1 VIEW COMPARISON:  Portable exam 1434 hours compared to at 11:11 2016 FINDINGS: Normal heart size, mediastinal contours, and pulmonary vascularity. Atherosclerotic calcification aorta. Minimal RIGHT basilar atelectasis. Lungs otherwise clear. No pulmonary infiltrate, pleural effusion, or pneumothorax. Bones demineralized. Surgical clips in the axilla bilaterally at  with probable BILATERAL prior mastectomy. IMPRESSION: Minimal RIGHT basilar atelectasis. Aortic Atherosclerosis (ICD10-I70.0). Electronically Signed   By: Lavonia Dana [name]   On: 10/18/2020 14:47   CT Head Wo Contrast  Result Date: 10/18/2020 CLINICAL DATA:  Level 2 fall on blood thinners. EXAM: CT HEAD WITHOUT CONTRAST CT CERVICAL SPINE WITHOUT CONTRAST TECHNIQUE: Multidetector CT imaging of the head and cervical spine was performed following the standard protocol without intravenous contrast. Multiplanar CT image reconstructions of the cervical spine were also generated. COMPARISON:  None. FINDINGS: CT HEAD FINDINGS Brain: No evidence of acute large vascular territory the infarction, hemorrhage, hydrocephalus,  extra-axial collection or mass lesion/mass effect. Mild age related global parenchymal volume loss. Mild burden of chronic white matter ischemic microvascular disease. Prominent bridging veins. Vascular: No hyperdense vessel. Atherosclerotic calcifications of the intracranial portions of the internal carotid arteries. Skull: Mild benign hyperostosis frontalis. Negative for fracture or focal lesion. Sinuses/Orbits: Mucous retention cyst in the left frontal sinus. Otherwise the paranasal sinuses and mastoid air cells are predominantly clear. Other: Small extra calvarial hematoma overlying the left calvarial apex. Exam is slightly motion degraded. CT CERVICAL SPINE FINDINGS Alignment: Likely positional straightening of the normal cervical lordosis. No traumatic listhesis. Skull base and vertebrae: No acute fracture. Bony ankylosis of the posterior elements at C2-C5. High density sclerotic lesion within the spinous process at T1, likely a bone island. Soft tissues and spinal canal: No prevertebral fluid or swelling. No visible canal hematoma. Disc levels: Multilevel degenerative change of the cervical spine with disc space narrowing, disc osteophyte complex ease, and uncovertebral/facet hypertrophy Upper chest: Biapical pleuroparenchymal scarring. Other: None IMPRESSION: 1. Small extra calvarial hematoma overlying the left calvarial apex. No underlying calvarial fracture or acute intracranial abnormality. 2. No evidence of acute fracture or traumatic listhesis of the cervical spine. 3. Multilevel degenerative change of the cervical spine. Electronically Signed   By: Dahlia Bailiff MD   On: 10/18/2020 15:14   CT Cervical Spine Wo Contrast  Result Date: 10/18/2020 CLINICAL DATA:  Level 2 fall on blood thinners. EXAM: CT HEAD WITHOUT CONTRAST CT CERVICAL SPINE WITHOUT CONTRAST TECHNIQUE: Multidetector CT imaging of the head and cervical spine was performed following the standard protocol without intravenous contrast.  Multiplanar CT image reconstructions of the cervical spine were also generated. COMPARISON:  None. FINDINGS: CT HEAD FINDINGS Brain: No evidence of acute large vascular territory the infarction, hemorrhage, hydrocephalus, extra-axial collection or mass lesion/mass effect. Mild age related global parenchymal volume loss. Mild burden of chronic white matter ischemic microvascular disease. Prominent bridging veins. Vascular: No hyperdense vessel. Atherosclerotic calcifications of the intracranial portions of the internal carotid arteries. Skull: Mild benign hyperostosis frontalis. Negative for fracture or focal lesion. Sinuses/Orbits: Mucous retention cyst in the left frontal sinus. Otherwise the paranasal sinuses and mastoid air cells are predominantly clear. Other: Small extra calvarial hematoma overlying the left calvarial apex. Exam is slightly motion degraded. CT CERVICAL SPINE FINDINGS Alignment: Likely positional straightening of the normal cervical lordosis. No traumatic listhesis. Skull base and vertebrae: No acute fracture. Bony ankylosis of the posterior elements at C2-C5. High density sclerotic lesion within the spinous process at T1, likely a bone island. Soft tissues and spinal canal: No prevertebral fluid or swelling. No visible canal hematoma. Disc levels: Multilevel degenerative change of the cervical spine with disc space narrowing, disc osteophyte complex ease, and uncovertebral/facet hypertrophy Upper chest: Biapical pleuroparenchymal scarring. Other: None IMPRESSION: 1. Small extra calvarial hematoma overlying the left calvarial apex. No underlying calvarial fracture or  acute intracranial abnormality. 2. No evidence of acute fracture or traumatic listhesis of the cervical spine. 3. Multilevel degenerative change of the cervical spine. Electronically Signed   By: Dahlia Bailiff MD   On: 10/18/2020 15:14    Procedures .Marland KitchenLaceration Repair  Date/Time: 10/18/2020 3:40 PM Performed by: Luna Fuse, MD Authorized by: Luna Fuse, MD   Laceration details:    Length (cm):  4 Pre-procedure details:    Preparation:  Patient was prepped and draped in usual sterile fashion Comments:     Lichen 1% no epinephrine total of 2 cc instilled to wound edges.  Site was irrigated with sterile saline under pressure ~500 cc.  Staples placed with good approximation of edges.  Pressure dressing placed afterwards.  Patient tolerated procedure well.  Advised return in 10 days for staple removal.     Medications Ordered in ED Medications  lidocaine (PF) (XYLOCAINE) 1 % injection 5 mL (has no administration in time range)  Tdap (BOOSTRIX) injection 0.5 mL (0.5 mLs Intramuscular Given 10/18/20 1453)    ED Course  I have reviewed the triage vital signs and the nursing notes.  Pertinent labs & imaging results that were available during my care of the patient were reviewed by me and considered in my medical decision making (see chart for details).    MDM Rules/Calculators/A&P                          Imaging of the brain shows no evidence of acute intracranial pathology.  CT C-spine cleared by radiology as well.  Scalp laceration stapled by myself.  5 staples placed, advised return in 10 days for staple removal.  Advised immediate return for signs of infection such as redness purulent discharge fevers worsening pain or any additional concerns.  Final Clinical Impression(s) / ED Diagnoses Final diagnoses:  Laceration of scalp, initial encounter    Rx / DC Orders ED Discharge Orders         Ordered    cephALEXin (KEFLEX) 500 MG capsule  3 times daily        10/18/20 1542           Luna Fuse, MD 10/18/20 1512    Luna Fuse, MD 10/18/20 [phone]

## 2020-10-18 NOTE — H&P (Signed)
History and Physical    Morgan [name] KGM:[id number] DOB: 02/02/1929 DOA: 10/18/2020  PCP: Deland Pretty, MD  Patient coming from: Home  I have personally briefly reviewed patient's old medical records in [place] Patient has second chart Marked for merge with MRN [id number]  Chief Complaint: Fall at home  HPI: Morgan [name] is a 87 y.o. female with medical history significant for HTN, HLD, hypothyroidism, CKD stage III, TIA, hard of hearing, breast cancer who presented to the ED for evaluation after a fall at home.  Patient states she was in her kitchen when she was stepping backwards and her slippers got caught on the floor and she lost her balance and fell landing on the ground.  She suffered a laceration at the time of her scalp.  She did not lose consciousness.  She denies any recent chest pain, palpitations, dyspnea, nausea, vomiting, abdominal pain, dysuria.  She normally ambulates with the use of a cane.  Patient reports ongoing significant pain across her lower back which improves when she is laying still but worsens with attempts to sit up, stand, or turn to the side.  She reports chronic neuropathic pain in her lower extremities which is unchanged.  She has not had any new weakness in her upper or lower extremities.  ED Course:  Initial vitals showed BP 190/80, pulse 73, RR 16, temp 97.2 F, SPO2 98% on room air.  Sodium 138, potassium 4.4, bicarb 25, BUN 22, creatinine 1.16, serum glucose 133, WBC 15.8, hemoglobin 11.9, platelets 207,000.  SARS-CoV-2 PCR panel was collected and pending.  Urinalysis is collected and pending.  Portable chest x-ray showed minimal right basilar atelectasis.  CT head without contrast showed a small extracalvarial hematoma overlying the left calvarial apex without underlying calvarial fracture or acute intracranial abnormality.  CT head cervical spine without contrast did not show evidence of acute fracture or traumatic listhesis of the  cervical spine.  Multilevel degenerative changes of the C-spine noted.  Lumbar spine x-ray is negative for lumbar spine fracture.  Pelvic x-ray is negative for pelvic fracture.  CT thoracic and lumbar spine without contrast are negative for acute fracture of the lumbar spine or thoracic spine.  Multilevel degenerative disc disease noted with degenerative anterolisthesis of L4 on L5 with canal stenosis and bilateral neural foraminal stenosis.  Scalp laceration was repaired by EDP with 5 staples placed.  Patient was given Norco for pain.  Patient was felt to have significant decline in gait with increased fall risk therefore the hospitalist service was consulted to admit for further evaluation and management.  Review of Systems: All systems reviewed and are negative except as documented in history of present illness above.   Past Medical History:  Diagnosis Date  . Chronic kidney disease (CKD), stage III (moderate) (HCC)   . History of TIA (transient ischemic attack)   . Hyperlipidemia   . Hypertension   . Hypothyroidism     Past Surgical History:  Procedure Laterality Date  . ZENKER'S DIVERTICULECTOMY ENDOSCOPIC  02/25/2016    Social History:  reports that she has never smoked. She has never used smokeless tobacco. She reports that she does not use drugs. No history on file for alcohol use.  Allergies  Allergen Reactions  . Strawberry Extract Hives    Family History  Problem Relation Age of Onset  . Breast cancer Mother   . Heart disease Father   . Diabetes Father      Prior to Admission medications  Medication Sig Start Date End Date Taking? Authorizing Provider  cephALEXin (KEFLEX) 500 MG capsule Take 1 capsule (500 mg total) by mouth 3 (three) times daily for 3 days. 10/18/20 10/21/20 Yes Luna Fuse, MD    Physical Exam: Vitals:   10/18/20 2030 10/18/20 2051 10/18/20 2100 10/18/20 2224  BP: (!) 160/81  (!) 146/67 (!) 126/54  Pulse: (!) 104  85 79  Resp: (!) 21   (!) 21 18  Temp:  98.1 F (36.7 C)  98.3 F (36.8 C)  TempSrc:  Temporal  Oral  SpO2: 100%  100% 99%  Weight:      Height:       Constitutional: Elderly woman resting supine in bed, NAD, calm, comfortable Eyes: PERRL, lids and conjunctivae normal ENMT: Mucous membranes are moist. Posterior pharynx clear of any exudate or lesions.Normal dentition.  Wound dressing wrapped around head/scalp laceration.  Hard of hearing. Neck: normal, supple, no masses. Respiratory: clear to auscultation bilaterally, no wheezing, no crackles. Normal respiratory effort. No accessory muscle use.  Cardiovascular: Regular rate and rhythm, no murmurs / rubs / gallops. No extremity edema. 2+ pedal pulses. Abdomen: no tenderness, no masses palpated.  Bowel sounds positive.  Musculoskeletal: no clubbing / cyanosis. No joint deformity upper and lower extremities. Good ROM of upper and lower extremities however diminished with movement at the lower back, no contractures. Normal muscle tone.  Skin: Scalp laceration s/p repair by EDP with wound wrapping in place Neurologic: CN 2-12 grossly intact. Sensation intact. Strength 5/5 in all 4.  Psychiatric: Normal judgment and insight. Alert and oriented x 3. Normal mood.   Labs on Admission: I have personally reviewed following labs and imaging studies  CBC: Recent Labs  Lab 10/18/20 1846  WBC 15.8*  NEUTROABS 13.7*  HGB 11.9*  HCT 33.4*  MCV 89.5  PLT 834   Basic Metabolic Panel: Recent Labs  Lab 10/18/20 1846  NA 138  K 4.4  CL 104  CO2 25  GLUCOSE 133*  BUN 22  CREATININE 1.16*  CALCIUM 9.3   GFR: Estimated Creatinine Clearance: 30.2 mL/min (A) (by C-G formula based on SCr of 1.16 mg/dL (H)). Liver Function Tests: No results for input(s): AST, ALT, ALKPHOS, BILITOT, PROT, ALBUMIN in the last 168 hours. No results for input(s): LIPASE, AMYLASE in the last 168 hours. No results for input(s): AMMONIA in the last 168 hours. Coagulation Profile: No  results for input(s): INR, PROTIME in the last 168 hours. Cardiac Enzymes: No results for input(s): CKTOTAL, CKMB, CKMBINDEX, TROPONINI in the last 168 hours. BNP (last 3 results) No results for input(s): PROBNP in the last 8760 hours. HbA1C: No results for input(s): HGBA1C in the last 72 hours. CBG: No results for input(s): GLUCAP in the last 168 hours. Lipid Profile: No results for input(s): CHOL, HDL, LDLCALC, TRIG, CHOLHDL, LDLDIRECT in the last 72 hours. Thyroid Function Tests: No results for input(s): TSH, T4TOTAL, FREET4, T3FREE, THYROIDAB in the last 72 hours. Anemia Panel: No results for input(s): VITAMINB12, FOLATE, FERRITIN, TIBC, IRON, RETICCTPCT in the last 72 hours. Urine analysis:    Component Value Date/Time   COLORURINE YELLOW 10/18/2020 2006   APPEARANCEUR CLEAR 10/18/2020 2006   LABSPEC 1.015 10/18/2020 2006   PHURINE 5.0 10/18/2020 2006   GLUCOSEU NEGATIVE 10/18/2020 2006   HGBUR NEGATIVE 10/18/2020 2006   BILIRUBINUR NEGATIVE 10/18/2020 2006   KETONESUR 5 (A) 10/18/2020 2006   PROTEINUR NEGATIVE 10/18/2020 2006   NITRITE NEGATIVE 10/18/2020 2006   LEUKOCYTESUR NEGATIVE 10/18/2020  2006    Radiological Exams on Admission: DG Chest 1 View  Result Date: 10/18/2020 CLINICAL DATA:  Fall, no loss of consciousness EXAM: CHEST  1 VIEW COMPARISON:  Portable exam 1434 hours compared to at 11:11 2016 FINDINGS: Normal heart size, mediastinal contours, and pulmonary vascularity. Atherosclerotic calcification aorta. Minimal RIGHT basilar atelectasis. Lungs otherwise clear. No pulmonary infiltrate, pleural effusion, or pneumothorax. Bones demineralized. Surgical clips in the axilla bilaterally at with probable BILATERAL prior mastectomy. IMPRESSION: Minimal RIGHT basilar atelectasis. Aortic Atherosclerosis (ICD10-I70.0). Electronically Signed   By: Lavonia Dana [name]   On: 10/18/2020 14:47   DG Lumbar Spine 2-3 Views  Result Date: 10/18/2020 CLINICAL DATA:  Fall with low back  and hip pain EXAM: LUMBAR SPINE - 2-3 VIEW COMPARISON:  None. FINDINGS: This report assumes 5 non rib-bearing lumbar vertebrae. Lumbar vertebral body heights are preserved, with no fracture. Moderate multilevel lumbar degenerative disc disease, most prominent at L1-2. Mild 3 mm retrolisthesis at T12-L1, 3 mm retrolisthesis at L2-3 and 5 mm anterolisthesis at L4-5. Marked bilateral lower lumbar facet arthropathy. No aggressive appearing focal osseous lesions. IMPRESSION: 1. No lumbar spine fracture. 2. Moderate multilevel lumbar degenerative disc disease. 3. Mild multilevel lumbar spondylolisthesis. Electronically Signed   By: Ilona Sorrel [name]   On: 10/18/2020 16:41   DG Pelvis 1-2 Views  Result Date: 10/18/2020 CLINICAL DATA:  Fall with hip and low back pain EXAM: PELVIS - 1-2 VIEW COMPARISON:  None. FINDINGS: No pelvic fracture or diastasis. No evidence of hip dislocation on this single frontal view. No suspicious focal osseous lesions. Degenerative changes in the visualized lower lumbar spine. IMPRESSION: No pelvic fracture. Electronically Signed   By: Ilona Sorrel [name]   On: 10/18/2020 16:38   CT Head Wo Contrast  Result Date: 10/18/2020 CLINICAL DATA:  Level 2 fall on blood thinners. EXAM: CT HEAD WITHOUT CONTRAST CT CERVICAL SPINE WITHOUT CONTRAST TECHNIQUE: Multidetector CT imaging of the head and cervical spine was performed following the standard protocol without intravenous contrast. Multiplanar CT image reconstructions of the cervical spine were also generated. COMPARISON:  None. FINDINGS: CT HEAD FINDINGS Brain: No evidence of acute large vascular territory the infarction, hemorrhage, hydrocephalus, extra-axial collection or mass lesion/mass effect. Mild age related global parenchymal volume loss. Mild burden of chronic white matter ischemic microvascular disease. Prominent bridging veins. Vascular: No hyperdense vessel. Atherosclerotic calcifications of the intracranial portions of the internal  carotid arteries. Skull: Mild benign hyperostosis frontalis. Negative for fracture or focal lesion. Sinuses/Orbits: Mucous retention cyst in the left frontal sinus. Otherwise the paranasal sinuses and mastoid air cells are predominantly clear. Other: Small extra calvarial hematoma overlying the left calvarial apex. Exam is slightly motion degraded. CT CERVICAL SPINE FINDINGS Alignment: Likely positional straightening of the normal cervical lordosis. No traumatic listhesis. Skull base and vertebrae: No acute fracture. Bony ankylosis of the posterior elements at C2-C5. High density sclerotic lesion within the spinous process at T1, likely a bone island. Soft tissues and spinal canal: No prevertebral fluid or swelling. No visible canal hematoma. Disc levels: Multilevel degenerative change of the cervical spine with disc space narrowing, disc osteophyte complex ease, and uncovertebral/facet hypertrophy Upper chest: Biapical pleuroparenchymal scarring. Other: None IMPRESSION: 1. Small extra calvarial hematoma overlying the left calvarial apex. No underlying calvarial fracture or acute intracranial abnormality. 2. No evidence of acute fracture or traumatic listhesis of the cervical spine. 3. Multilevel degenerative change of the cervical spine. Electronically Signed   By: Dahlia Bailiff MD   On:  10/18/2020 15:14   CT Cervical Spine Wo Contrast  Result Date: 10/18/2020 CLINICAL DATA:  Level 2 fall on blood thinners. EXAM: CT HEAD WITHOUT CONTRAST CT CERVICAL SPINE WITHOUT CONTRAST TECHNIQUE: Multidetector CT imaging of the head and cervical spine was performed following the standard protocol without intravenous contrast. Multiplanar CT image reconstructions of the cervical spine were also generated. COMPARISON:  None. FINDINGS: CT HEAD FINDINGS Brain: No evidence of acute large vascular territory the infarction, hemorrhage, hydrocephalus, extra-axial collection or mass lesion/mass effect. Mild age related global  parenchymal volume loss. Mild burden of chronic white matter ischemic microvascular disease. Prominent bridging veins. Vascular: No hyperdense vessel. Atherosclerotic calcifications of the intracranial portions of the internal carotid arteries. Skull: Mild benign hyperostosis frontalis. Negative for fracture or focal lesion. Sinuses/Orbits: Mucous retention cyst in the left frontal sinus. Otherwise the paranasal sinuses and mastoid air cells are predominantly clear. Other: Small extra calvarial hematoma overlying the left calvarial apex. Exam is slightly motion degraded. CT CERVICAL SPINE FINDINGS Alignment: Likely positional straightening of the normal cervical lordosis. No traumatic listhesis. Skull base and vertebrae: No acute fracture. Bony ankylosis of the posterior elements at C2-C5. High density sclerotic lesion within the spinous process at T1, likely a bone island. Soft tissues and spinal canal: No prevertebral fluid or swelling. No visible canal hematoma. Disc levels: Multilevel degenerative change of the cervical spine with disc space narrowing, disc osteophyte complex ease, and uncovertebral/facet hypertrophy Upper chest: Biapical pleuroparenchymal scarring. Other: None IMPRESSION: 1. Small extra calvarial hematoma overlying the left calvarial apex. No underlying calvarial fracture or acute intracranial abnormality. 2. No evidence of acute fracture or traumatic listhesis of the cervical spine. 3. Multilevel degenerative change of the cervical spine. Electronically Signed   By: Dahlia Bailiff MD   On: 10/18/2020 15:14   CT Thoracic Spine Wo Contrast  Result Date: 10/18/2020 CLINICAL DATA:  Spine fracture, thoracic, traumatic Fall with back pain. EXAM: CT THORACIC SPINE WITHOUT CONTRAST TECHNIQUE: Multidetector CT images of the thoracic were obtained using the standard protocol without intravenous contrast. COMPARISON:  None. FINDINGS: Alignment: Moderate dextroscoliotic curvature. Vertebrae: No acute  fracture or focal pathologic process. Paraspinal and other soft tissues: Biapical pleuroparenchymal scarring. There is a moderate-sized hiatal hernia. There is a 19 mm linear high density structure in the cervical esophagus at the level of C7-T1, unclear if this is within a Zenker's diverticulum or the esophageal lumen. Disc levels: Disc space narrowing with endplate spurring at multiple levels in the mid lower thoracic spine. No high-grade canal stenosis. IMPRESSION: 1. No acute fracture of the thoracic spine. 2. Moderate dextroscoliotic curvature with multilevel degenerative disc disease. 3. Linear high-density 19 mm structure either within the cervical esophagus or a Zenker's diverticulum is suspicious for retained ingested bone. Recommend correlation for foreign body sensation. 4. Moderate-sized hiatal hernia. Electronically Signed   By: Keith Rake [name]   On: 10/18/2020 19:43   CT Lumbar Spine Wo Contrast  Result Date: 10/18/2020 CLINICAL DATA:  87 year old with back pain after fall. EXAM: CT LUMBAR SPINE WITHOUT CONTRAST TECHNIQUE: Multidetector CT imaging of the lumbar spine was performed without intravenous contrast administration. Multiplanar CT image reconstructions were also generated. COMPARISON:  Radiograph earlier today. FINDINGS: Segmentation: 5 lumbar type vertebrae. Alignment: 5 mm anterolisthesis of L4 on L5. 2 mm retrolisthesis of T12 on L1 and L1 on L2. Mild broad-based levo scoliotic curvature of the lumbar spine. Vertebrae: No acute fracture or focal pathologic process. Scattered Modic endplate changes related to degenerative disc disease.  Paraspinal and other soft tissues: No acute or unexpected findings. Cholecystectomy. Aortic atherosclerosis. Scattered colonic diverticulosis. Disc levels: Disc space narrowing, endplate spurring, and vacuum phenomenon at all lumbar levels. There is facet hypertrophy at all levels, prominent involving L3-L4, L4-L5, and L5-S1. There is canal stenosis  at L4-L5 which is multifactorial. Bilateral neural foraminal stenosis at this level. IMPRESSION: 1. No acute fracture of the lumbar spine. 2. Multilevel degenerative disc disease and facet hypertrophy throughout the lumbar spine. Mild scoliosis. 3. Degenerative anterolisthesis of L4 on L5 with canal stenosis and bilateral neural foraminal stenosis at this level. Aortic Atherosclerosis (ICD10-I70.0). Electronically Signed   By: Keith Rake [name]   On: 10/18/2020 19:31    EKG: Not performed.  Assessment/Plan Principal Problem:   Fall at home, initial encounter Active Problems:   Hypothyroidism   Hypertension   Hyperlipidemia   History of TIA (transient ischemic attack)   Chronic kidney disease (CKD), stage III (moderate) (HCC)   Morgan [name] is a 87 y.o. female with medical history significant for HTN, HLD, hypothyroidism, CKD stage III, TIA, hard of hearing, breast cancer who is admitted after fall at home.  Fall at home with persistent low back pain and gait abnormality: Occurring after mechanical fall.  CT imaging of thoracic and lumbar spine largely reassuring without evidence of vertebral fracture.  Pelvic x-ray without evidence of fracture as well.  Mobility still significantly limited due to lower back pain and she remains a high risk for further falls. -Continue Tylenol and Norco as needed for pain control -Can give IV Dilaudid if needed for severe pain -PT/OT eval  Scalp laceration: Occurring after fall at home.  S/p laceration repair in the ED.  History of TIA: Resume home Plavix and statin.  CKD stage III: Chronic and appears stable.  Hypothyroidism: Continue Synthroid.  Hypertension: Continue losartan, Lopressor.  Hyperlipidemia: Continue simvastatin.  DVT prophylaxis: Lovenox Code Status: Full code, confirmed with patient Family Communication: Discussed with patient's daughter at bedside Disposition Plan: From home and likely discharge to home pending  PT/OT eval Consults called: None Level of care: Med-Surg Admission status:   Status is: Observation  The patient remains OBS appropriate and will d/c before 2 midnights.  Dispo: The patient is from: Home              Anticipated d/c is to: Home versus SNF              Patient currently is not medically stable to d/c.  Zada Finders MD [facility]  If 7PM-7AM, please contact night-coverage [email or web address]  10/18/2020, 11:05 PM

## 2020-10-18 NOTE — ED Notes (Signed)
Pt continues to have pelvic pain with any movement.

## 2020-10-19 ENCOUNTER — Inpatient Hospital Stay (HOSPITAL_COMMUNITY): Payer: Medicare Other

## 2020-10-19 DIAGNOSIS — Z853 Personal history of malignant neoplasm of breast: Secondary | ICD-10-CM | POA: Diagnosis not present

## 2020-10-19 DIAGNOSIS — S0101XA Laceration without foreign body of scalp, initial encounter: Secondary | ICD-10-CM | POA: Diagnosis present

## 2020-10-19 DIAGNOSIS — Y92009 Unspecified place in unspecified non-institutional (private) residence as the place of occurrence of the external cause: Secondary | ICD-10-CM

## 2020-10-19 DIAGNOSIS — Z8673 Personal history of transient ischemic attack (TIA), and cerebral infarction without residual deficits: Secondary | ICD-10-CM | POA: Diagnosis not present

## 2020-10-19 DIAGNOSIS — Z8249 Family history of ischemic heart disease and other diseases of the circulatory system: Secondary | ICD-10-CM | POA: Diagnosis not present

## 2020-10-19 DIAGNOSIS — R262 Difficulty in walking, not elsewhere classified: Secondary | ICD-10-CM | POA: Diagnosis present

## 2020-10-19 DIAGNOSIS — Z23 Encounter for immunization: Secondary | ICD-10-CM | POA: Diagnosis present

## 2020-10-19 DIAGNOSIS — Z803 Family history of malignant neoplasm of breast: Secondary | ICD-10-CM | POA: Diagnosis not present

## 2020-10-19 DIAGNOSIS — S060X0A Concussion without loss of consciousness, initial encounter: Secondary | ICD-10-CM | POA: Diagnosis present

## 2020-10-19 DIAGNOSIS — W19XXXA Unspecified fall, initial encounter: Secondary | ICD-10-CM | POA: Diagnosis not present

## 2020-10-19 DIAGNOSIS — S0990XA Unspecified injury of head, initial encounter: Secondary | ICD-10-CM | POA: Diagnosis present

## 2020-10-19 DIAGNOSIS — W010XXA Fall on same level from slipping, tripping and stumbling without subsequent striking against object, initial encounter: Secondary | ICD-10-CM | POA: Diagnosis present

## 2020-10-19 DIAGNOSIS — Z833 Family history of diabetes mellitus: Secondary | ICD-10-CM | POA: Diagnosis not present

## 2020-10-19 DIAGNOSIS — E039 Hypothyroidism, unspecified: Secondary | ICD-10-CM | POA: Diagnosis present

## 2020-10-19 DIAGNOSIS — Z7902 Long term (current) use of antithrombotics/antiplatelets: Secondary | ICD-10-CM | POA: Diagnosis not present

## 2020-10-19 DIAGNOSIS — Z20822 Contact with and (suspected) exposure to covid-19: Secondary | ICD-10-CM | POA: Diagnosis present

## 2020-10-19 DIAGNOSIS — Y92000 Kitchen of unspecified non-institutional (private) residence as  the place of occurrence of the external cause: Secondary | ICD-10-CM | POA: Diagnosis not present

## 2020-10-19 DIAGNOSIS — K449 Diaphragmatic hernia without obstruction or gangrene: Secondary | ICD-10-CM | POA: Diagnosis present

## 2020-10-19 DIAGNOSIS — E785 Hyperlipidemia, unspecified: Secondary | ICD-10-CM | POA: Diagnosis present

## 2020-10-19 DIAGNOSIS — I129 Hypertensive chronic kidney disease with stage 1 through stage 4 chronic kidney disease, or unspecified chronic kidney disease: Secondary | ICD-10-CM | POA: Diagnosis present

## 2020-10-19 DIAGNOSIS — N1832 Chronic kidney disease, stage 3b: Secondary | ICD-10-CM | POA: Diagnosis present

## 2020-10-19 LAB — CBC
HCT: 30.8 % — ABNORMAL LOW (ref 36.0–46.0)
Hemoglobin: 10.5 g/dL — ABNORMAL LOW (ref 12.0–15.0)
MCH: 30.8 pg (ref 26.0–34.0)
MCHC: 34.1 g/dL (ref 30.0–36.0)
MCV: 90.3 fL (ref 80.0–100.0)
Platelets: 202 10*3/uL (ref 150–400)
RBC: 3.41 MIL/uL — ABNORMAL LOW (ref 3.87–5.11)
RDW: 13.5 % (ref 11.5–15.5)
WBC: 10.3 10*3/uL (ref 4.0–10.5)
nRBC: 0 % (ref 0.0–0.2)

## 2020-10-19 LAB — BASIC METABOLIC PANEL
Anion gap: 8 (ref 5–15)
BUN: 21 mg/dL (ref 8–23)
CO2: 25 mmol/L (ref 22–32)
Calcium: 8.8 mg/dL — ABNORMAL LOW (ref 8.9–10.3)
Chloride: 104 mmol/L (ref 98–111)
Creatinine, Ser: 1.2 mg/dL — ABNORMAL HIGH (ref 0.44–1.00)
GFR, Estimated: 43 mL/min — ABNORMAL LOW (ref >60)
Glucose, Bld: 134 mg/dL — ABNORMAL HIGH (ref 70–99)
Potassium: 4.4 mmol/L (ref 3.5–5.1)
Sodium: 137 mmol/L (ref 135–145)

## 2020-10-19 MED ORDER — LACTATED RINGERS IV SOLN: INTRAVENOUS | Status: DC

## 2020-10-19 NOTE — Evaluation (Signed)
Physical Therapy Evaluation Patient Details Name: Morgan [name] MRN: [id number] DOB: 1929/06/16 Today's Date: 10/19/2020   History of Present Illness  Pt is 87 yo female admitted on 10/18/20 s/p fall with head laceration and low back pain. Pt fell posteriorly hitting her head.  She did have a head laceration.  All imaging was negative for acute changes. Pt with PMH including HTN, HLD, hypothyroidism, CKD, TIA, breast CA, and HOH.  Clinical Impression   Pt admitted with above diagnosis. Pt lives with daughter who works from home (this week only) and is not able to provide 24 hr care.  Pt is normally independent with cane or rollator and likes to work in garden and walk for exercise.  Pt very pleasant and motivated but limited due to R low back pain.  All imaging was negative for acute injury - did palpate tightness in paraspinal muscles. Pt requiring mod A for transfers and assist to steady with gait.  She is fall risk.  Ideally, pt would benefit from SNF placement but may not be approved due to observation status. If she goes home will need max HH services, assist for all transfers, and possible w/c pending progress. Recommend aggressive therapy to maximize independence prior to discharge. Pt currently with functional limitations due to the deficits listed below (see PT Problem List). Pt will benefit from skilled PT to increase their independence and safety with mobility to allow discharge to the venue listed below.       Follow Up Recommendations SNF;Other (comment) (If SNF not approved , max HH services (PT/OT/nurse aide) and supervision for mobility)    Equipment Recommendations  3in1 (PT) (possible w/c)    Recommendations for Other Services       Precautions / Restrictions Precautions Precautions: Fall      Mobility  Bed Mobility Overal bed mobility: Needs Assistance Bed Mobility: Rolling;Sidelying to Sit Rolling: Mod assist Sidelying to sit: Mod assist;HOB elevated        General bed mobility comments: Pt able to move legs to EOB with increased time - assist to roll and lift trunk with use of bed pad; mod A to scoot forward; limited due to pain    Transfers Overall transfer level: Needs assistance Equipment used: Rolling walker (2 wheeled) Transfers: Sit to/from Stand Sit to Stand: Min assist         General transfer comment: Cues for hand placement; min A to rise with increased time; min A for controlled sit  Ambulation/Gait Ambulation/Gait assistance: Min assist Gait Distance (Feet): 8 Feet Assistive device: Rolling walker (2 wheeled) Gait Pattern/deviations: Step-to pattern;Decreased stride length;Trunk flexed Gait velocity: decreased   General Gait Details: Min A to steady; ambulated 8 feet slowly and labored due to pain; trunk flexed - dtr reports that is baseline  Financial trader [employer] (Stroke Patients Only)       Balance Overall balance assessment: Needs assistance Sitting-balance support: No upper extremity supported Sitting balance-Leahy Scale: Fair Sitting balance - Comments: Static sit without support - did not challenge due to back pain   Standing balance support: Bilateral upper extremity supported Standing balance-Leahy Scale: Poor Standing balance comment: requiring RW and min A                             Pertinent Vitals/Pain Pain Assessment: Faces Faces Pain Scale: Hurts even more Pain  Location: mid and R back with transfers and ambulation - no pain at rest Pain Descriptors / Indicators: Grimacing;Moaning;Sore;Sharp Pain Intervention(s): Limited activity within patient's tolerance;Monitored during session;Repositioned;Relaxation    Home Living Family/patient expects to be discharged to:: Private residence Living Arrangements: Children (daughter and son in Sports [employer]) Available Help at Discharge: Family;Available PRN/intermittently (daughter working at home this  week) Type of Home: House Home Access: Stairs to enter Entrance Stairs-Rails: Left Entrance Stairs-Number of Steps: 4 Home Layout: One level Home Equipment: Cane - single point;Walker - 4 wheels Additional Comments: reports small bathrooms and narrow doorways    Prior Function Level of Independence: Independent with assistive device(s)   Gait / Transfers Assistance Needed: Pt pretty active, likes to walk up and down driveway with rollator; uses cane in house  ADL's / Homemaking Assistance Needed: independent with ADLs  Comments: likes to work in garden     Journalist, newspaper        Extremity/Trunk Assessment   Upper Extremity Assessment Upper Extremity Assessment: Defer to OT evaluation    Lower Extremity Assessment Lower Extremity Assessment: LLE deficits/detail;RLE deficits/detail RLE Deficits / Details: ROM WFL; MMT at least 3/5 but not further tested due to back pain LLE Deficits / Details: ROM WFL; MMT at least 3/5 but not further tested due to back pain    Cervical / Trunk Assessment Cervical / Trunk Assessment: Kyphotic;Other exceptions Cervical / Trunk Exceptions: Pt with c/o mid back and R back pain - noted tightness in paraspinal muscles on R with palpation around L3-5 area - perform soft tissue massage and pt reports felt good  Communication   Communication: HOH  Cognition Arousal/Alertness: Awake/alert Behavior During Therapy: WFL for tasks assessed/performed Overall Cognitive Status: Within Functional Limits for tasks assessed                                 General Comments: Pt very sweet and motivated. She is HOH.  Daughter present - reports pt with high pain tolerance and won't complain.      General Comments General comments (skin integrity, edema, etc.): Pt reports dizziness and nausea/vomitting upon sitting that eased after a minute.  She did not have syncopal symptoms and pt's color remained good.  EOEM intact , no nystagmus; Gaze  Stabilization intact no nystagmus.   Educated pt and daughter on PT role and POC.  Discussed SNF would be ideal but likely not approved by insurance as pt is observation pt.  If home, dtr reports she is there this week but working from home and could not provide 24 hr support.    Exercises     Assessment/Plan    PT Assessment Patient needs continued PT services  PT Problem List Decreased strength;Decreased mobility;Decreased range of motion;Decreased knowledge of precautions;Decreased activity tolerance;Decreased balance;Decreased knowledge of use of DME;Pain       PT Treatment Interventions DME instruction;Therapeutic activities;Modalities;Gait training;Therapeutic exercise;Patient/family education;Stair training;Balance training;Functional mobility training;Manual techniques    PT Goals (Current goals can be found in the Care Plan section)  Acute Rehab PT Goals Patient Stated Goal: daughter reports - needs pt to be able to move well enough to return home safely PT Goal Formulation: With patient/family Time For Goal Achievement: 11/02/20 Potential to Achieve Goals: Good    Frequency Min 4X/week   Barriers to discharge Decreased caregiver support;Inaccessible home environment      Co-evaluation  AM-PAC PT "6 Clicks" Mobility  Outcome Measure Help needed turning from your back to your side while in a flat bed without using bedrails?: A Little Help needed moving from lying on your back to sitting on the side of a flat bed without using bedrails?: A Lot Help needed moving to and from a bed to a chair (including a wheelchair)?: A Little Help needed standing up from a chair using your arms (e.g., wheelchair or bedside chair)?: A Little Help needed to walk in hospital room?: A Little Help needed climbing 3-5 steps with a railing? : A Lot 6 Click Score: 16    End of Session Equipment Utilized During Treatment: Gait belt Activity Tolerance: Patient limited by  pain Patient left: with chair alarm set;in chair;with family/visitor present;with call bell/phone within reach Nurse Communication: Mobility status PT Visit Diagnosis: History of falling (Z91.81);Unsteadiness on feet (R26.81);Pain Pain - Right/Left: Right Pain - part of body:  (low back)    Time: 7225-7505 (out of room 8 mins for MD) PT Time Calculation (min) (ACUTE ONLY): 40 min   Charges:   PT Evaluation $PT Eval Moderate Complexity: 1 Mod PT Treatments $Gait Training: 8-22 mins        Abran Richard, PT Acute Rehab Services Pager [phone] [facility] [place] 10/19/2020, 11:47 AM

## 2020-10-19 NOTE — Evaluation (Signed)
Occupational Therapy Evaluation Patient Details Name: Morgan [name] MRN: [id number] DOB: 11/10/1928 Today's Date: 10/19/2020    History of Present Illness Pt is 87 yo female admitted on 10/18/20 s/p fall with head laceration and low back pain. Pt fell posteriorly hitting her head.  She did have a head laceration.  All imaging was negative for acute changes. Pt with PMH including HTN, HLD, hypothyroidism, CKD, TIA, breast CA, and HOH.   Clinical Impression   Pt presents with decline in function and safety with ADLs and ADL mobility with impaired strength, balance and endurance. PTA, pt lives at home with her daughter and family, was Ind with ADLs/selfcare, cane and rollater for mobility, simple meal prep. Pt currently requires max A, mod - min A for transfers using RW. Pt would benefit from acute OT services to maximize level of function and safety    Follow Up Recommendations  SNF (If unable to go to SNF, max HH services, OT/PT, HH aide)    Equipment Recommendations  3 in 1 bedside commode;Wheelchair (measurements OT)    Recommendations for Other Services       Precautions / Restrictions Precautions Precautions: Fall Restrictions Weight Bearing Restrictions: No      Mobility Bed Mobility               General bed mobility comments: pt in recliner upon arrival    Transfers Overall transfer level: Needs assistance Equipment used: Rolling walker (2 wheeled) Transfers: Sit to/from Stand Sit to Stand: Mod assist;Min assist         General transfer comment: Cues for hand placement; min A to rise with increased time; min A for controlled sit    Balance Overall balance assessment: Needs assistance Sitting-balance support: No upper extremity supported Sitting balance-Leahy Scale: Fair     Standing balance support: Bilateral upper extremity supported;During functional activity Standing balance-Leahy Scale: Poor                             ADL either  performed or assessed with clinical judgement   ADL Overall ADL's : Needs assistance/impaired Eating/Feeding: Set up;Independent;Sitting   Grooming: Wash/dry hands;Wash/dry face;Min guard;Standing   Upper Body Bathing: Min guard;Sitting   Lower Body Bathing: Maximal assistance   Upper Body Dressing : Min guard;Sitting   Lower Body Dressing: Maximal assistance   Toilet Transfer: Moderate assistance;Minimal assistance;Ambulation;RW;BSC;Cueing for safety;Cueing for sequencing   Toileting- Clothing Manipulation and Hygiene: Maximal assistance       Functional mobility during ADLs: Minimal assistance;Rolling walker;Cueing for safety;Cueing for sequencing       Vision Patient Visual Report: No change from baseline       Perception     Praxis      Pertinent Vitals/Pain Pain Assessment: Faces Faces Pain Scale: Hurts little more Pain Location: mid and R back with transfers and ambulation - no pain at rest Pain Descriptors / Indicators: Grimacing;Sore;Guarding Pain Intervention(s): Limited activity within patient's tolerance;Monitored during session;Repositioned     Hand Dominance Right   Extremity/Trunk Assessment Upper Extremity Assessment Upper Extremity Assessment: Generalized weakness   Lower Extremity Assessment Lower Extremity Assessment: Defer to PT evaluation   Cervical / Trunk Assessment Cervical / Trunk Assessment: Kyphotic;Other exceptions Cervical / Trunk Exceptions: Pt with c/o mid back and R back pain   Communication Communication Communication: HOH   Cognition Arousal/Alertness: Awake/alert Behavior During Therapy: WFL for tasks assessed/performed Overall Cognitive Status: Within Functional Limits for tasks assessed  General Comments: Pt very sweet and motivated. She is HOH.  Daughter present - reports pt with high pain tolerance and won't complain.   General Comments       Exercises     Shoulder  Instructions      Home Living Family/patient expects to be discharged to:: Private residence Living Arrangements: Children Available Help at Discharge: Family;Available PRN/intermittently Type of Home: House Home Access: Stairs to enter [employer] of Steps: 4 Entrance Stairs-Rails: Left Home Layout: One level     Bathroom Shower/Tub: Occupational psychologist: Standard     Home Equipment: Cane - single point;Walker - 4 wheels   Additional Comments: reports small bathrooms and narrow doorways      Prior Functioning/Environment Level of Independence: Independent with assistive device(s)  Gait / Transfers Assistance Needed: Pt pretty active, likes to walk up and down driveway with rollator; uses cane in house ADL's / Homemaking Assistance Needed: independent with ADLs, simple meal prep   Comments: likes to work in garden        OT Problem List: Decreased strength;Impaired balance (sitting and/or standing);Decreased activity tolerance;Decreased knowledge of use of DME or AE;Decreased safety awareness      OT Treatment/Interventions: Self-care/ADL training;Patient/family education;Therapeutic activities;DME and/or AE instruction    OT Goals(Current goals can be found in the care plan section) Acute Rehab OT Goals Patient Stated Goal: daughter reports - needs pt to be able to move well enough to return home safely OT Goal Formulation: With patient Time For Goal Achievement: 11/02/20 Potential to Achieve Goals: Good ADL Goals Pt Will Perform Grooming: with supervision;with set-up;standing Pt Will Perform Upper Body Bathing: with supervision;with set-up;sitting Pt Will Perform Lower Body Bathing: with mod assist;sitting/lateral leans;sit to/from stand Pt Will Perform Upper Body Dressing: with supervision;with set-up;sitting Pt Will Transfer to Toilet: with min guard assist Pt Will Perform Toileting - Clothing Manipulation and hygiene: with min assist;sit  to/from stand  OT Frequency: Min 2X/week   Barriers to D/C:            Co-evaluation              AM-PAC OT "6 Clicks" Daily Activity     Outcome Measure Help from another person eating meals?: None Help from another person taking care of personal grooming?: A Little Help from another person toileting, which includes using toliet, bedpan, or urinal?: A Lot Help from another person bathing (including washing, rinsing, drying)?: A Lot Help from another person to put on and taking off regular upper body clothing?: A Little Help from another person to put on and taking off regular lower body clothing?: A Lot 6 Click Score: 16   End of Session Equipment Utilized During Treatment: Gait belt;Rolling walker;Other (comment) [facility]) Nurse Communication: Mobility status  Activity Tolerance: Patient limited by fatigue Patient left: in chair;with call bell/phone within reach;with chair alarm set;with family/visitor present  OT Visit Diagnosis: Unsteadiness on feet (R26.81);History of falling (Z91.81);Other abnormalities of gait and mobility (R26.89);Muscle weakness (generalized) (M62.81);Pain Pain - part of body:  (back)                Time: 1236-1300 OT Time Calculation (min): 24 min Charges:  OT General Charges $OT Visit: 1 Visit OT Evaluation $OT Eval Moderate Complexity: 1 Mod OT Treatments $Self Care/Home Management : 8-22 mins    Britt Bottom 10/19/2020, 4:15 PM

## 2020-10-19 NOTE — Progress Notes (Signed)
Appropriate Use Committee Chart Review  Chart reviewed by the physician advisor with input from [facility] and the attending MD as needed for review of the appropriateness for SNF referral.  TOC notes, PT/OT/ST notes, nursing notes and physician notes reviewed for medical necessity to determine if the patient's needs are appropriate for short-term rehab to return to a prior level of function versus the likely need for custodial care.  At this time, the patient meets Medicare criteria for SNF placement.   Recommendations: The patient is SNF appropriate for short-term rehab/skilled nursing interventions.  A consult to the Transitions of Care Team has been made to facilitate placement.    Jacquelynn Cree, MD Chief Physician Advisor  10/19/2020 5:03 PM

## 2020-10-19 NOTE — Plan of Care (Signed)

## 2020-10-19 NOTE — Plan of Care (Signed)

## 2020-10-19 NOTE — Progress Notes (Addendum)
[facility] Progress Note  Patient: Morgan [name]    KGM:[id number]  DOA: 10/18/2020     Date of Service: the patient was seen and examined on 10/19/2020  Brief hospital course: Past medical history of HTN, HLD, hypothyroidism, CKD 3, TIA, breast cancer, hard of hearing.  Presents with complaints of a fall at home. Incidentally found to have a foreign body-like structure in her Zenker's diverticulum. Currently plan is further work-up for foreign body in Zenker's diverticulum as well as monitor for improvement and stability and work for safe discharge.  Assessment and Plan: Fall at home with persistent low back pain and gait abnormality: Likely concussion injury. Presents with a mechanical fall at home. Did not passed out.  No focal deficit at the time of my evaluation. CT head negative for acute fracture or intracranial injury. Small extra cranial hematoma currently wrapped. No fracture noted on C-spine, T-spine as well as L-spine CT scan.. Continues to have severe back pain requiring pain medication. Patient unable to walk without assistance.  Even with assistance is only able to walk 8 feet. Patient lives with family but spent 10 to 12 hours/day alone on her own therefore remains at risk for poor outcome and therefore unsafe discharge. she remains a high risk for further falls. -Continue Tylenol and Norco as needed for pain control Continue aggressive therapy.  Scalp laceration: Occurring after fall at home.   S/p laceration repair in the ED. Dressing changes as needed 10 days for staple removal on 3/24.  History of Zenker's diverticulum surgery. 19 mm linear high density structure in cervical esophagus. Patient had Zenker's diverticulum surgery in 2017 with Dr. [name]. CT T-spine is showing a linear structure in that area concerning for a retained ingested bone. Patient denies any complaints of dysphagia or odynophagia or episode of choking. Discussed with ENT on-call  at [facility], partner of Dr. [name].  Recommend rechecking plain film of neck and they will most likely will follow up in the clinic. Plain film negative for any radiopaque foreign body. We will discuss with ENT tomorrow for further assistance.  Moderate size hiatal hernia. Patient remains at risk for aspiration secondary to this.  History of TIA: Resume home Plavix and statin. Monitor worsening of scalp laceration bleeding.  Mild renal insufficiency on CKD stage III: We will provide IV fluid and monitor renal function.  Hypothyroidism: Continue Synthroid.  Hypertension: Continue losartan, Lopressor.  Hyperlipidemia: Continue simvastatin.  Diet: Dysphagia 3 diet DVT Prophylaxis:   Place and maintain sequential compression device Start: 10/19/20 1343    Advance goals of care discussion: Full code  Family Communication: family was present at bedside, at the time of interview.  The pt provided permission to discuss medical plan with the family. Opportunity was given to ask question and all questions were answered satisfactorily.   Disposition:  Status is: Inpatient  Remains inpatient appropriate because: Further work-up needed for foreign body in Zenker's diverticulum. Minimal oral intake with mild renal dysfunction therefore requires IV fluid.  Dispo: The patient is from: Home              Anticipated d/c is to: SNF              Patient currently is not medically stable to d/c.   Difficult to place patient No  Subjective: No nausea no vomiting no fever no chills no chest pain.  Reports fatigue and tiredness.  Also reports occasional dizziness.  Minimal oral intake.  Physical Exam:  General:  Appear in mild distress, no Rash; Oral Mucosa Clear, moist. no Abnormal Neck Mass Or lumps, Conjunctiva normal  Cardiovascular: S1 and S2 Present, no Murmur, Respiratory: good respiratory effort, Bilateral Air entry present and CTA, no Crackles, no wheezes Abdomen: Bowel Sound  present, Soft and no tenderness Extremities: no Pedal edema Neurology: alert and oriented to place and person affect appropriate. no new focal deficit Gait not checked due to patient safety concerns  Vitals:   10/19/20 0535 10/19/20 0904 10/19/20 1536 10/19/20 1919  BP: (!) 141/63 (!) 128/51 102/62 127/62  Pulse: 81 80 80 68  Resp: 18 17 14 16   Temp: 98.2 F (36.8 C) 97.7 F (36.5 C) 98.6 F (37 C) 98.2 F (36.8 C)  TempSrc: Oral Oral Oral Oral  SpO2: 98% 99% 100% 94%  Weight:      Height:        Intake/Output Summary (Last 24 hours) at 10/19/2020 2033 Last data filed at 10/19/2020 2000 Gross per 24 hour  Intake 820 ml  Output 950 ml  Net -130 ml   Filed Weights   10/18/20 1428 10/18/20 1429  Weight: 70.3 kg 70.3 kg    Data Reviewed: I have personally reviewed and interpreted daily labs, tele strips, imaging. I reviewed all nursing notes, pharmacy notes, vitals, pertinent old records I have discussed plan of care as described above with RN and patient/family.  CBC: Recent Labs  Lab 10/18/20 1846 10/19/20 0313  WBC 15.8* 10.3  NEUTROABS 13.7*  --   HGB 11.9* 10.5*  HCT 33.4* 30.8*  MCV 89.5 90.3  PLT 207 628   Basic Metabolic Panel: Recent Labs  Lab 10/18/20 1846 10/19/20 0313  NA 138 137  K 4.4 4.4  CL 104 104  CO2 25 25  GLUCOSE 133* 134*  BUN 22 21  CREATININE 1.16* 1.20*  CALCIUM 9.3 8.8*    Studies: DG Neck Soft Tissue  Result Date: 10/19/2020 CLINICAL DATA:  History of Zenker diverticulum question foreign body EXAM: NECK SOFT TISSUES - 1+ VIEW COMPARISON:  None FINDINGS: Osseous demineralization with scattered degenerative disc and facet disease changes of cervical spine with lateral cervical flexion to the RIGHT. Prevertebral soft tissues normal thickness. Epiglottis and aryepiglottic folds unremarkable. No radiopaque foreign bodies identified. Lung apices clear. IMPRESSION: No radiopaque foreign bodies identified. Osseous demineralization with  degenerative disc/facet disease changes of cervical spine with scoliosis. Electronically Signed   By: Lavonia Dana [name]   On: 10/19/2020 17:17    Scheduled Meds: . clopidogrel  75 mg Oral Daily  . levothyroxine  100 mcg Oral Daily  . losartan  50 mg Oral Daily  . metoprolol tartrate  25 mg Oral Daily  . simvastatin  10 mg Oral QHS   Continuous Infusions: . lactated ringers 75 mL/hr at 10/19/20 1443   PRN Meds: acetaminophen **OR** acetaminophen, HYDROcodone-acetaminophen, ondansetron **OR** ondansetron (ZOFRAN) IV, senna-docusate  Time spent: 35 minutes  Author: Berle Mull, MD [facility] 10/19/2020 8:33 PM  To reach On-call, see care teams to locate the attending and reach out via [email or web address]. Between 7PM-7AM, please contact night-coverage If you still have difficulty reaching the attending provider, please page the [facility] (Director on Call) for [facility] on amion for assistance.

## 2020-10-20 DIAGNOSIS — Z8673 Personal history of transient ischemic attack (TIA), and cerebral infarction without residual deficits: Secondary | ICD-10-CM

## 2020-10-20 NOTE — Progress Notes (Signed)
[facility] Progress Note  Patient: Morgan [name]    KVQ:[id number]  DOA: 10/18/2020     Date of Service: the patient was seen and examined on 10/20/2020  Brief hospital course: Past medical history of HTN, HLD, hypothyroidism, CKD 3, TIA, breast cancer, hard of hearing.  Presents with complaints of a fall at home. Incidentally found to have a foreign body-like structure in her Zenker's diverticulum. Currently plan is further work-up for foreign body in Zenker's diverticulum as well as monitor for improvement and stability and work for safe discharge.  Assessment and Plan: Fall at home, concussion: No syncopal symptoms. CT head negative for intracranial injury though did show small extracranial hematoma. No fracture noted on C-spine, T-spine as well as L-spine CT scan. - Continue tylenol and norco as needed for pain control for back pain though this is improving.  - Continue working with PT, remains SNF-appropriate. Patient unable to walk without assistance. Even with assistance is only able to walk 8 feet. Patient lives with family but spent 10 to 12 hours/day alone on her own therefore remains at risk for poor outcome and therefore unsafe discharge, remaining at high risk for further falls.  Scalp laceration, hematoma: s/p staples.  - Remove staples ~3/24 - May remove bandage now that 48 hours out. Avoiding anticoagulation due to this at this time.   History of Zenker's diverticulum: s/p surgery 2017 with Dr. [name]. CT T-spine is showing a linear structure in that area concerning for a retained ingested bone though this was absent on follow up imaging. The patient has no symptoms attributable to this.  - Previous hospitalist discussed with ENT who recommended follow up in the clinic.   Moderate size hiatal hernia. Patient remains at risk for aspiration secondary to this.  History of TIA: Resume home Plavix and statin. Monitor worsening of scalp laceration bleeding.  AKI on CKD  stage IIIb: - Continue gentle IVF while PO intake picks up.   Hypothyroidism: Continue Synthroid.  Hypertension: Continue losartan, Lopressor.  Hyperlipidemia: Continue simvastatin.  Diet: Dysphagia 3 diet DVT Prophylaxis:  SCDs   Advance goals of care discussion: Full code Family Communication: Daughter at bedside.  Disposition:  Status is: Inpatient  Remains inpatient appropriate because: Continues requirement for IV fluids.   Dispo: The patient is from: Home              Anticipated d/c is to: SNF              Patient currently is not medically stable to d/c.   Difficult to place patient No  Subjective: Irritated by bandage on head. Has had no bleeding, no HA, and no severe pain. Eager to rehabilitate and is working diligently with PT. Does not have adequate assistance at home. She denies any dysphagia, odynophagia, chest/neck pain or globus sensation.  Physical Exam: Vitals:   10/20/20 0440 10/20/20 0800 10/20/20 1503 10/20/20 1933  BP: (!) 131/53 (!) 124/54 (!) 120/41 (!) 111/40  Pulse: 89 94 82 87  Resp: 18 14 16 16   Temp: 98.2 F (36.8 C) 98.2 F (36.8 C) 98.9 F (37.2 C) 98.4 F (36.9 C)  TempSrc: Oral Oral Oral Oral  SpO2: 97% 95% 98% 95%  Weight:      Height:        Intake/Output Summary (Last 24 hours) at 10/20/2020 2137 Last data filed at 10/20/2020 1800 Gross per 24 hour  Intake 720 ml  Output 3 ml  Net 717 ml   [employer]  10/18/20 1428 10/18/20 1429  Weight: 70.3 kg 70.3 kg   Gen: Elderly, pleasant female in no distress Pulm: Nonlabored breathing room air. Clear. CV: Regular rate and rhythm. No murmur, rub, or gallop. No JVD, no dependent edema. GI: Abdomen soft, non-tender, non-distended, with normoactive bowel sounds.  Ext: Warm, no deformities Skin: Bandage on head, otherwise no rashes, lesions or ulcers on visualized skin. Neuro: Alert and oriented. No focal neurological deficits. Psych: Judgement and insight appear fair. Mood  euthymic & affect congruent. Behavior is appropriate.     Scheduled Meds: . clopidogrel  75 mg Oral Daily  . levothyroxine  100 mcg Oral Daily  . losartan  50 mg Oral Daily  . metoprolol tartrate  25 mg Oral Daily  . simvastatin  10 mg Oral QHS   Continuous Infusions: . lactated ringers 75 mL/hr at 10/19/20 1443   PRN Meds: acetaminophen **OR** acetaminophen, HYDROcodone-acetaminophen, ondansetron **OR** ondansetron (ZOFRAN) IV, senna-docusate  Time spent: 35 minutes  Vance Gather, MD [facility] 10/20/2020 9:37 PM

## 2020-10-20 NOTE — Progress Notes (Signed)
Occupational Therapy Treatment Patient Details Name: Morgan [name] MRN: [id number] DOB: [birth year] Today's Date: 10/20/2020    History of present illness Pt is 87 yo female admitted on 10/18/20 s/p fall with head laceration and low back pain.  All imaging was negative for acute changes including back imaging. Pt with PMH including HTN, HLD, hypothyroidism, CKD, TIA, breast CA, and HOH.   OT comments  Pt making good progress with functional goals. Pt up in recliner upon arrival and worked with PT earlier. Pt eager and motivated to participate in therapy. Sit - stand from recliner to RW min A, ambulation to bathroom for toilet transfers min - min guard A, clothing mgt mod A , hygiene min guard A. Pt stood at sink to wash and dry hands min guard A with verbal cues for safety with RW. OT will continue to follow acutely to maximize level of function and safety  Follow Up Recommendations  SNF;Supervision/Assistance - 24 hour    Equipment Recommendations  3 in 1 bedside commode;Wheelchair (measurements OT)    Recommendations for Other Services      Precautions / Restrictions Precautions Precautions: Fall Restrictions Weight Bearing Restrictions: No       Mobility Bed Mobility               General bed mobility comments: Pt was OOB in the recliner chair.    Transfers Overall transfer level: Needs assistance Equipment used: Rolling walker (2 wheeled) Transfers: Sit to/from Stand Sit to Stand: Min assist         General transfer comment: min assist to come to standing from chair, extra time needed to scoot to the edge of the chair.    Balance Overall balance assessment: Needs assistance Sitting-balance support: Feet supported;Bilateral upper extremity supported Sitting balance-Leahy Scale: Fair     Standing balance support: Bilateral upper extremity supported;During functional activity Standing balance-Leahy Scale: Poor Standing balance comment: requiring RW and min  A                           ADL either performed or assessed with clinical judgement   ADL Overall ADL's : Needs assistance/impaired     Grooming: Wash/dry hands;Wash/dry face;Min guard;Standing           Upper Body Dressing : Set up;Supervision/safety;Sitting       Toilet Transfer: Minimal assistance;Ambulation;RW;Cueing for safety;Cueing for sequencing;Comfort height toilet;Grab bars   Toileting- Clothing Manipulation and Hygiene: Moderate assistance;Sit to/from stand       Functional mobility during ADLs: Minimal assistance;Rolling walker;Cueing for safety;Cueing for sequencing       Vision Patient Visual Report: No change from baseline     Perception     Praxis      Cognition Arousal/Alertness: Awake/alert Behavior During Therapy: WFL for tasks assessed/performed Overall Cognitive Status: Impaired/Different from baseline Area of Impairment: Memory                     Memory: Decreased short-term memory         General Comments: likely her baseline        Exercises Exercises: General Upper Extremity;General Lower Extremity General Exercises - Upper Extremity Shoulder Flexion: AROM;Both;10 reps Elbow Flexion: AROM;Both;10 reps General Exercises - Lower Extremity Ankle Circles/Pumps: AROM;Both;10 reps Long Arc Quad: AROM;Both;10 reps Hip Flexion/Marching: AROM;Both;10 reps   Shoulder Instructions       General Comments      Pertinent Vitals/ Pain  Pain Assessment: Faces Faces Pain Scale: Hurts little more Pain Location: mid and R back Pain Descriptors / Indicators: Grimacing;Guarding Pain Intervention(s): Monitored during session;Repositioned  Home Living                                          Prior Functioning/Environment              Frequency  Min 2X/week        Progress Toward Goals  OT Goals(current goals can now be found in the care plan section)  Progress towards OT goals:  Progressing toward goals  Acute Rehab OT Goals Patient Stated Goal: to go to rehab with end goal of going back home  Plan Discharge plan needs to be updated    Co-evaluation                 AM-PAC OT "6 Clicks" Daily Activity     Outcome Measure   Help from another person eating meals?: None Help from another person taking care of personal grooming?: A Little Help from another person toileting, which includes using toliet, bedpan, or urinal?: A Little Help from another person bathing (including washing, rinsing, drying)?: A Lot Help from another person to put on and taking off regular upper body clothing?: A Little Help from another person to put on and taking off regular lower body clothing?: A Lot 6 Click Score: 17    End of Session Equipment Utilized During Treatment: Gait belt;Rolling walker;Other (comment) (3 in 1 over toilet)  OT Visit Diagnosis: Unsteadiness on feet (R26.81);History of falling (Z91.81);Other abnormalities of gait and mobility (R26.89);Muscle weakness (generalized) (M62.81);Pain Pain - part of body:  (back)   Activity Tolerance Patient tolerated treatment well   Patient Left in chair;with call bell/phone within reach;with chair alarm set   Nurse Communication          Time: 8110-3159 OT Time Calculation (min): 15 min  Charges: OT General Charges $OT Visit: 1 Visit OT Treatments $Self Care/Home Management : 8-22 mins     Britt Bottom 10/20/2020, 4:53 PM

## 2020-10-20 NOTE — Plan of Care (Signed)

## 2020-10-20 NOTE — Progress Notes (Signed)
Physical Therapy Treatment Patient Details Name: Morgan [name] MRN: [id number] DOB: 29-Jul-1929 Today's Date: 10/20/2020    History of Present Illness Pt is 87 yo female admitted on 10/18/20 s/p fall with head laceration and low back pain.  All imaging was negative for acute changes including back imaging. Pt with PMH including HTN, HLD, hypothyroidism, CKD, TIA, breast CA, and HOH.    PT Comments    Pt reporting less low back pain today.  She still requires min assist for transitions and gait with RW across the room and back.  She was able to participate in seated UE/LE exercise program.  She remains appropriate for SNF level rehab before returning home.  PT will continue to follow acutely for safe mobility progression.   Follow Up Recommendations  SNF     Equipment Recommendations  3in1 (PT)    Recommendations for Other Services       Precautions / Restrictions Precautions Precautions: Fall    Mobility  Bed Mobility               General bed mobility comments: Pt was OOB in the recliner chair.    Transfers Overall transfer level: Needs assistance Equipment used: Rolling walker (2 wheeled) Transfers: Sit to/from Stand Sit to Stand: Min assist         General transfer comment: min assist to come to standing from chair, extra time needed to scoot to the edge of the chair.  Ambulation/Gait Ambulation/Gait assistance: Min assist Gait Distance (Feet): 20 Feet Assistive device: Rolling walker (2 wheeled) Gait Pattern/deviations: Step-through pattern;Shuffle;Trunk flexed     General Gait Details: Pt with flexed trunk gait pattern, looking down at feet, cues for upright posture and to look up, can only maintain for a few seconds.   Stairs             Wheelchair Mobility    Modified Rankin (Stroke Patients Only)       Balance Overall balance assessment: Needs assistance Sitting-balance support: Feet supported;Bilateral upper extremity  supported Sitting balance-Leahy Scale: Fair     Standing balance support: Bilateral upper extremity supported Standing balance-Leahy Scale: Poor Standing balance comment: requiring RW and min A                            Cognition Arousal/Alertness: Awake/alert Behavior During Therapy: WFL for tasks assessed/performed Overall Cognitive Status: Impaired/Different from baseline Area of Impairment: Memory                     Memory: Decreased short-term memory         General Comments: likely her baseline      Exercises General Exercises - Upper Extremity Shoulder Flexion: AROM;Both;10 reps Elbow Flexion: AROM;Both;10 reps General Exercises - Lower Extremity Ankle Circles/Pumps: AROM;Both;10 reps Long Arc Quad: AROM;Both;10 reps Hip Flexion/Marching: AROM;Both;10 reps    General Comments        Pertinent Vitals/Pain Pain Assessment: Faces Faces Pain Scale: Hurts little more Pain Location: mid and R back Pain Descriptors / Indicators: Grimacing;Guarding Pain Intervention(s): Limited activity within patient's tolerance;Monitored during session;Repositioned    Home Living                      Prior Function            PT Goals (current goals can now be found in the care plan section) Acute Rehab PT Goals Patient Stated Goal: to  go to rehab with end goal of going back home Progress towards PT goals: Progressing toward goals    Frequency    Min 3X/week      PT Plan Current plan remains appropriate    Co-evaluation              AM-PAC PT "6 Clicks" Mobility   Outcome Measure  Help needed turning from your back to your side while in a flat bed without using bedrails?: A Little Help needed moving from lying on your back to sitting on the side of a flat bed without using bedrails?: A Little Help needed moving to and from a bed to a chair (including a wheelchair)?: A Little Help needed standing up from a chair using your  arms (e.g., wheelchair or bedside chair)?: A Little Help needed to walk in hospital room?: A Little Help needed climbing 3-5 steps with a railing? : A Lot 6 Click Score: 17    End of Session Equipment Utilized During Treatment: Gait belt Activity Tolerance: Patient limited by pain Patient left: in chair;with call bell/phone within reach;with chair alarm set   PT Visit Diagnosis: History of falling (Z91.81);Unsteadiness on feet (R26.81);Pain Pain - Right/Left: Right Pain - part of body:  (low back)     Time: 1638-4536 PT Time Calculation (min) (ACUTE ONLY): 19 min  Charges:  $Therapeutic Exercise: 8-22 mins                     Verdene Lennert, PT, DPT  Acute Rehabilitation [phone] pager [phone]) [phone] office

## 2020-10-20 NOTE — TOC CAGE-AID Note (Signed)
Transition of Care [facility]) - CAGE-AID Screening   Patient Details  Name: Morgan [name] MRN: [id number] Date of Birth: 20-Aug-1928   Elvina Sidle, RN Trauma Response Nurse  10/20/2020, 12:58 PM   Clinical Narrative:  Pt denies any alcohol/drug use    CAGE-AID Screening:    Have You Ever Felt You Ought to Cut Down on Your Drinking or Drug Use?: No Have People Annoyed You By Critizing Your Drinking Or Drug Use?: No Have You Felt Bad Or Guilty About Your Drinking Or Drug Use?: No Have You Ever Had a Drink or Used Drugs First Thing In The Morning to Steady Your Nerves or to Get Rid of a Hangover?: No CAGE-AID Score: 0  Substance Abuse Education Offered: No

## 2020-10-21 NOTE — NC FL2 (Signed)
[place] MEDICAID FL2 LEVEL OF CARE SCREENING TOOL     IDENTIFICATION  Patient Name: Morgan [name] Birthdate: 14-Jul-1929 Sex: female Admission Date (Current Location): 10/18/2020  [facility] and [place] Number:  Herbalist and Address:  [facility] [place] [address], [place], [place] [address]      Provider Number: [id number]  Attending Physician Name and Address:  Patrecia Pour, MD  Relative Name and Phone Number:  Darden Palmer Daughter [phone]  [phone]    Current Level of Care: Hospital Recommended Level of Care: [place] Prior Approval Number:    Date Approved/Denied:   PASRR Number: [id number]  Discharge Plan: SNF    Current Diagnoses: Patient Active Problem List   Diagnosis Date Noted  . Fall 10/19/2020  . Fall at home, initial encounter 10/18/2020  . Hypothyroidism   . Hypertension   . Hyperlipidemia   . History of TIA (transient ischemic attack)   . Chronic kidney disease (CKD), stage III (moderate) (HCC)     Orientation RESPIRATION BLADDER Height & Weight     Self,Time,Situation,Place  Normal Continent Weight: 155 lb (70.3 kg) Height:  5\' 2"  (157.5 cm)  BEHAVIORAL SYMPTOMS/MOOD NEUROLOGICAL BOWEL NUTRITION STATUS      Continent Diet (DYS 3, see discharge summary)  AMBULATORY STATUS COMMUNICATION OF NEEDS Skin   Limited Assist Verbally Other (Comment) (ecchymosis)                       Personal Care Assistance Level of Assistance  Bathing,Feeding,Dressing Bathing Assistance: Maximum assistance Feeding assistance: Independent Dressing Assistance: Maximum assistance     Functional Limitations Info  Sight,Hearing,Speech Sight Info: Adequate Hearing Info: Impaired Speech Info: Adequate    SPECIAL CARE FACTORS FREQUENCY  PT (By licensed PT),OT (By licensed OT)     PT Frequency: 5x week OT Frequency: 5x week            Contractures Contractures Info: Not present     Additional Factors Info  Code Status,Allergies Code Status Info: full Allergies Info: strawberry extract           Current Medications (10/21/2020):  This is the current hospital active medication list Current Facility-Administered Medications  Medication Dose Route Frequency Provider Last Rate Last Admin  . acetaminophen (TYLENOL) tablet 1,000 mg  1,000 mg Oral Q6H PRN Lenore Cordia, MD       Or  . acetaminophen (TYLENOL) suppository 650 mg  650 mg Rectal Q6H PRN Zada Finders R, MD      . clopidogrel (PLAVIX) tablet 75 mg  75 mg Oral Daily Lenore Cordia, MD   75 mg at 10/21/20 0908  . HYDROcodone-acetaminophen (NORCO/VICODIN) 5-325 MG per tablet 1-2 tablet  1-2 tablet Oral Q6H PRN Lenore Cordia, MD   1 tablet at 10/21/20 0434  . lactated ringers infusion   Intravenous Continuous Lavina Hamman, MD 75 mL/hr at 10/19/20 1443 New Bag at 10/19/20 1443  . levothyroxine (SYNTHROID) tablet 100 mcg  100 mcg Oral Daily Lenore Cordia, MD   100 mcg at 10/21/20 0556  . losartan (COZAAR) tablet 50 mg  50 mg Oral Daily Lenore Cordia, MD   50 mg at 10/21/20 0908  . metoprolol tartrate (LOPRESSOR) tablet 25 mg  25 mg Oral Daily Lenore Cordia, MD   25 mg at 10/21/20 0908  . ondansetron (ZOFRAN) tablet 4 mg  4 mg Oral Q6H PRN Lenore Cordia, MD  Or  . ondansetron (ZOFRAN) injection 4 mg  4 mg Intravenous Q6H PRN Zada Finders R, MD      . senna-docusate (Senokot-S) tablet 1 tablet  1 tablet Oral QHS PRN Zada Finders R, MD      . simvastatin (ZOCOR) tablet 10 mg  10 mg Oral QHS Lenore Cordia, MD   10 mg at 10/20/20 2019     Discharge Medications: Please see discharge summary for a list of discharge medications.  Relevant Imaging Results:  Relevant Lab Results:   Additional Information SSN [id number]  Joanne Chars, LCSW

## 2020-10-21 NOTE — Plan of Care (Signed)

## 2020-10-21 NOTE — TOC Initial Note (Signed)
Transition of Care [facility]) - Initial/Assessment Note    Patient Details  Name: Morgan [name] MRN: [id number] Date of Birth: 01/11/1929  Transition of Care [facility]) CM/SW Contact:    Joanne Chars, LCSW Phone Number: 10/21/2020, 9:59 AM  Clinical Narrative:  CSW met with pt in room to discuss discharge plan.  Pt quite hard of hearing, was able to communicate regarding plan for SNF and pt appears in agreement with this.  Permission given to speak with daughter.  CSW spoke with daugther Joelene Millin by phone.  She is also in agreement with plan for SNF and has been researching options.  First choice is [facility], also interested in [address], [facility], and [facility].  Discussed process of sending out referrals and we will then discuss bed offers.  Pt is vaccinated and boosted for covid.                   Expected Discharge Plan: Skilled Nursing Facility Barriers to Discharge: Continued Medical Work up,SNF Pending bed offer   Patient Goals and CMS Choice   CMS Medicare.gov Compare Post Acute Care list provided to:: Patient Represenative (must comment) Choice offered to / list presented to : Adult Children  Expected Discharge Plan and Services Expected Discharge Plan: [place] In-house Referral: Clinical Social Work   Post Acute Care Choice: [place] Living arrangements for the past 2 months: [place]                                      Prior Living Arrangements/Services Living arrangements for the past 2 months: Single Family Home Lives with:: Adult Children (daughter and son in [place]) Patient language and need for interpreter reviewed:: Yes        Need for Family Participation in Patient Care: Yes (Comment) Care giver support system in place?: Yes (comment) Current home services: Other (comment) (none) Criminal Activity/Legal Involvement Pertinent to Current Situation/Hospitalization: No - Comment as needed  Activities of Daily  Living   ADL Screening (condition at time of admission) Patient's cognitive ability adequate to safely complete daily activities?: Yes Is the patient deaf or have difficulty hearing?: Yes Does the patient have difficulty seeing, even when wearing glasses/contacts?: No Does the patient have difficulty concentrating, remembering, or making decisions?: No Patient able to express need for assistance with ADLs?: Yes Does the patient have difficulty dressing or bathing?: No Independently performs ADLs?: Yes (appropriate for developmental age) Does the patient have difficulty walking or climbing stairs?: No Weakness of Legs: None Weakness of Arms/Hands: None  Permission Sought/Granted Permission sought to share information with : Family Supports Permission granted to share information with : Yes, Verbal Permission Granted  Share Information with NAME: daughter Joelene Millin  Permission granted to share info w AGENCY: SNF        Emotional Assessment Appearance:: Appears stated age Attitude/Demeanor/Rapport: Engaged (hard of hearing) Affect (typically observed): Appropriate,Pleasant Orientation: : Oriented to Self,Oriented to Place,Oriented to  Time,Oriented to Situation Alcohol / Substance Use: Not Applicable Psych Involvement: No (comment)  Admission diagnosis:  Injury of head, initial encounter [F02.77AJ] Fall, initial encounter [W19.XXXA] Laceration of scalp, initial encounter [S01.01XA] Fall at home, initial encounter [W19.Merril Abbe, O87.867] Fall [W19.XXXA] Patient Active Problem List   Diagnosis Date Noted  . Fall 10/19/2020  . Fall at home, initial encounter 10/18/2020  . Hypothyroidism   . Hypertension   . Hyperlipidemia   .  History of TIA (transient ischemic attack)   . Chronic kidney disease (CKD), stage III (moderate) ([place])    PCP:  Deland Pretty, MD Pharmacy:   [facility] [phone] - [place], [address] AT [facility] [address] [place] [address] Phone: [phone] Fax: [phone]     Social Determinants of Health ([place]) Interventions    Readmission Risk Interventions No flowsheet data found.

## 2020-10-21 NOTE — Progress Notes (Signed)
Physical Therapy Treatment Patient Details Name: Morgan [name] MRN: [id number] DOB: 1929-05-18 Today's Date: 10/21/2020    History of Present Illness Pt is 87 yo female admitted on 10/18/20 s/p fall with head laceration and low back pain.  All imaging was negative for acute changes including back imaging. Pt with PMH including HTN, HLD, hypothyroidism, CKD, TIA, breast CA, and HOH.    PT Comments    Pt was able to walk a good distance down the hallway today. She remains mod assist to get to her feet and min assist for gait, but her endurance is improving.  I did follow her with the chair for safe encouragement of walking as far as she could.  We reviewed and added some exercises to UE/LE exercise routine.  PT will continue to follow acutely for safe mobility progression.  Follow Up Recommendations  SNF     Equipment Recommendations  3in1 (PT)    Recommendations for Other Services       Precautions / Restrictions Precautions Precautions: Fall    Mobility  Bed Mobility               General bed mobility comments: Pt was OOB in the recliner chair.  She reports it is more comfortable and easier to get out of then the bed.    Transfers Overall transfer level: Needs assistance Equipment used: Rolling walker (2 wheeled) Transfers: Sit to/from Stand Sit to Stand: Mod assist         General transfer comment: Mod assist to support trunk during transition to standing from recliner chair and from toilet today.  Ambulation/Gait Ambulation/Gait assistance: Min assist Gait Distance (Feet): 110 Feet Assistive device: Rolling walker (2 wheeled) Gait Pattern/deviations: Step-through pattern;Shuffle;Trunk flexed     General Gait Details: cues for upright posture and closer proximity to RW during gait. Chair pulled behind to encourage increased gait distance.   Stairs             Wheelchair Mobility    Modified Rankin (Stroke Patients Only)       Balance  Overall balance assessment: Needs assistance Sitting-balance support: Feet supported;Bilateral upper extremity supported Sitting balance-Leahy Scale: Fair     Standing balance support: Bilateral upper extremity supported Standing balance-Leahy Scale: Poor Standing balance comment: requiring RW and min A                            Cognition Arousal/Alertness: Awake/alert Behavior During Therapy: WFL for tasks assessed/performed Overall Cognitive Status: Impaired/Different from baseline Area of Impairment: Memory                     Memory: Decreased short-term memory         General Comments: likely her baseline      Exercises General Exercises - Upper Extremity Shoulder Flexion: AROM;Both;10 reps Elbow Flexion: AROM;Both;10 reps General Exercises - Lower Extremity Ankle Circles/Pumps: AROM;Both;10 reps Long Arc Quad: AROM;Both;10 reps Hip ABduction/ADduction: AROM;Both;10 reps Hip Flexion/Marching: AROM;Both;10 reps    General Comments        Pertinent Vitals/Pain Pain Assessment: Faces Faces Pain Scale: Hurts even more Pain Location: bil knees today R>L Pain Descriptors / Indicators: Grimacing;Guarding Pain Intervention(s): Limited activity within patient's tolerance;Monitored during session;Repositioned    Home Living                      Prior Function  PT Goals (current goals can now be found in the care plan section) Acute Rehab PT Goals Patient Stated Goal: to go to rehab with end goal of going back home Progress towards PT goals: Progressing toward goals    Frequency    Min 3X/week      PT Plan Current plan remains appropriate    Co-evaluation              AM-PAC PT "6 Clicks" Mobility   Outcome Measure  Help needed turning from your back to your side while in a flat bed without using bedrails?: A Little Help needed moving from lying on your back to sitting on the side of a flat bed without  using bedrails?: A Little Help needed moving to and from a bed to a chair (including a wheelchair)?: A Little Help needed standing up from a chair using your arms (e.g., wheelchair or bedside chair)?: A Lot Help needed to walk in hospital room?: A Little Help needed climbing 3-5 steps with a railing? : Total 6 Click Score: 15    End of Session Equipment Utilized During Treatment: Gait belt Activity Tolerance: Patient limited by pain;Patient limited by fatigue Patient left: in chair;with call bell/phone within reach;with chair alarm set   PT Visit Diagnosis: History of falling (Z91.81);Unsteadiness on feet (R26.81);Pain Pain - Right/Left: Right Pain - part of body: Knee     Time: 9532-0233 PT Time Calculation (min) (ACUTE ONLY): 37 min  Charges:  $Gait Training: 8-22 mins $Therapeutic Activity: 8-22 mins                     Verdene Lennert, PT, DPT  Acute Rehabilitation [phone] pager [phone]) [phone] office

## 2020-10-21 NOTE — Plan of Care (Signed)
  Problem: Activity: Goal: Risk for activity intolerance will decrease Outcome: Progressing   Problem: Safety: Goal: Ability to remain free from injury will improve Outcome: Progressing   

## 2020-10-21 NOTE — Progress Notes (Signed)
[facility] Progress Note  Patient: Morgan [name]    PPI:[id number]  DOA: 10/18/2020     Brief hospital course: Past medical history of HTN, HLD, hypothyroidism, CKD 3, TIA, breast cancer, hard of hearing.  Presents with complaints of a fall at home, found to have scalp laceration requiring staple repair and suspected to have had concussion. Imaging demonstrated no acute fractures.  Incidentally found to have a foreign body-like structure in her Zenker's diverticulum though this was absent on recheck.  The patient will require SNF level of care at discharge prior to returning home.  Assessment and Plan: Fall at home, concussion: No syncopal symptoms. CT head negative for intracranial injury though did show small extracranial hematoma. No fracture noted on C-spine, T-spine as well as L-spine CT scan. - Continue tylenol and norco as needed for pain control for back pain though this is improving.  - Continue working with PT, remains SNF-appropriate. Patient unable to walk without assistance. Even with assistance is only able to walk 8 feet. Patient lives with family but spent 10 to 12 hours/day alone on her own therefore remains at risk for poor outcome and therefore unsafe discharge, remaining at high risk for further falls.  Scalp laceration, hematoma: s/p staples. Avoiding anticoagulation due to this at this time.  - Remove staples ~3/24  History of Zenker's diverticulum: s/p surgery 2017 with Dr. [name]. CT T-spine is showing a linear structure in that area concerning for a retained ingested bone though this was absent on follow up imaging. The patient has no symptoms attributable to this.  - Previous hospitalist discussed with ENT who recommended follow up in the clinic.   Moderate size hiatal hernia. Patient remains at risk for aspiration secondary to this.  History of TIA: Resume home Plavix and statin. Monitor worsening of scalp laceration bleeding.  AKI on CKD stage IIIb: -  DC IVF since po intake improved.  Hypothyroidism: Continue Synthroid.  Hypertension: Continue losartan, Lopressor.  Hyperlipidemia: Continue simvastatin.  Diet: Dysphagia 3 diet DVT Prophylaxis:  SCDs   Advance goals of care discussion: Full code Family Communication: Daughter at bedside.  Disposition:  Status is: Inpatient  Remains inpatient appropriate because: Continues requirement for IV fluids.   Dispo: The patient is from: Home              Anticipated d/c is to: SNF              Patient currently is medically stable to d/c. Awaiting bed offers.    Difficult to place patient No  Subjective: Feels no pain, getting up with assistance to [facility] and sitting in chair. Still has no dysphagia, odynophagia or globus sensation. No bleeding. Ready to continue rehabilitation at SNF.  Physical Exam: Vitals:   10/20/20 1933 10/21/20 0426 10/21/20 0742 10/21/20 1429  BP: (!) 111/40 (!) 120/57 (!) 111/50 (!) 121/54  Pulse: 87 84 81 76  Resp: 16 18 14 16   Temp: 98.4 F (36.9 C) 98.5 F (36.9 C) 98.2 F (36.8 C) 97.7 F (36.5 C)  TempSrc: Oral Oral Oral Oral  SpO2: 95% 99% 98% 100%  Weight:      Height:        Intake/Output Summary (Last 24 hours) at 10/21/2020 1747 Last data filed at 10/21/2020 1654 Gross per 24 hour  Intake 720 ml  Output 6 ml  Net 714 ml   Filed Weights   10/18/20 1428 10/18/20 1429  Weight: 70.3 kg 70.3 kg   Gen: Elderly female  in no distress Pulm: Nonlabored breathing room air. Clear. CV: Regular rate and rhythm. No murmur, rub, or gallop. No JVD, no dependent edema. GI: Abdomen soft, non-tender, non-distended, with normoactive bowel sounds.  Ext: Warm, no deformities Skin: No new rashes, lesions or ulcers on visualized skin. Scalp laceration edges well apposed with 8 staples, hemostatic.  Neuro: Alert and oriented. No focal neurological deficits. Psych: Judgement and insight appear fair. Mood euthymic & affect congruent. Behavior is  appropriate.     Scheduled Meds: . clopidogrel  75 mg Oral Daily  . levothyroxine  100 mcg Oral Daily  . losartan  50 mg Oral Daily  . metoprolol tartrate  25 mg Oral Daily  . simvastatin  10 mg Oral QHS   Continuous Infusions: . lactated ringers 75 mL/hr at 10/19/20 1443   PRN Meds: acetaminophen **OR** acetaminophen, HYDROcodone-acetaminophen, ondansetron **OR** ondansetron (ZOFRAN) IV, senna-docusate  Time spent: 25 minutes  Vance Gather, MD [facility] 10/21/2020 5:47 PM

## 2020-10-22 LAB — SARS CORONAVIRUS 2 (TAT 6-24 HRS): SARS Coronavirus 2: NEGATIVE

## 2020-10-22 NOTE — Progress Notes (Signed)
Physical Therapy Treatment Patient Details Name: Morgan [name] MRN: [id number] DOB: 07/30/1929 Today's Date: 10/22/2020    History of Present Illness Pt is 87 yo female admitted on 10/18/20 s/p fall with head laceration and low back pain.  All imaging was negative for acute changes including back imaging. Per MD note, concussion. Pt with PMH including HTN, HLD, hypothyroidism, CKD, TIA, breast CA, and HOH.    PT Comments    Pt making good progress today.  She recalled cues for gait but did need reminders to implement at times.  Requiring min-mod A for transfers and min A to steady with gait.  Pt normally able to ambulate up and down driveway independently.  Continue to recommend SNF>     Follow Up Recommendations  SNF     Equipment Recommendations       Recommendations for Other Services       Precautions / Restrictions Precautions Precautions: Fall Restrictions Weight Bearing Restrictions: No    Mobility  Bed Mobility               General bed mobility comments: In recliner at arrival    Transfers Overall transfer level: Needs assistance Equipment used: Rolling walker (2 wheeled) Transfers: Sit to/from Stand Sit to Stand: Min assist;Mod assist         General transfer comment: Cues for hand placement and foot placement.  MOd A to rise from chair but min A when she had grab bar at toilet.  Assist withADLS  Ambulation/Gait Ambulation/Gait assistance: Min assist Gait Distance (Feet): 130 Feet Assistive device: Rolling walker (2 wheeled) Gait Pattern/deviations: Step-through pattern;Shuffle;Trunk flexed Gait velocity: decreased   General Gait Details: Cues for upright posture, looking up, and closer proximity to RW during gait. Pt recalled these cues from yesterday and reports trying to implement.   Stairs             Wheelchair Mobility    Modified Rankin (Stroke Patients Only)       Balance Overall balance assessment: Needs  assistance Sitting-balance support: Feet supported;No upper extremity supported Sitting balance-Leahy Scale: Fair     Standing balance support: Bilateral upper extremity supported Standing balance-Leahy Scale: Poor Standing balance comment: requiring RW and min A                            Cognition Arousal/Alertness: Awake/alert Behavior During Therapy: WFL for tasks assessed/performed Overall Cognitive Status: Within Functional Limits for tasks assessed Area of Impairment: Memory                     Memory: Decreased short-term memory         General Comments: likely her baseline      Exercises      General Comments        Pertinent Vitals/Pain Pain Assessment: Faces Faces Pain Scale: Hurts a little bit Pain Location: generalized stiffness Pain Intervention(s): Limited activity within patient's tolerance;Monitored during session;Repositioned    Home Living Family/patient expects to be discharged to:: Skilled nursing facility                    Prior Function            PT Goals (current goals can now be found in the care plan section) Acute Rehab PT Goals Patient Stated Goal: to go to rehab with end goal of going back home PT Goal Formulation: With patient/family Time For  Goal Achievement: 11/02/20 Potential to Achieve Goals: Good Progress towards PT goals: Progressing toward goals    Frequency    Min 3X/week      PT Plan Current plan remains appropriate    Co-evaluation              AM-PAC PT "6 Clicks" Mobility   Outcome Measure  Help needed turning from your back to your side while in a flat bed without using bedrails?: A Little Help needed moving from lying on your back to sitting on the side of a flat bed without using bedrails?: A Little Help needed moving to and from a bed to a chair (including a wheelchair)?: A Little Help needed standing up from a chair using your arms (e.g., wheelchair or bedside  chair)?: A Lot Help needed to walk in hospital room?: A Little Help needed climbing 3-5 steps with a railing? : A Lot 6 Click Score: 16    End of Session Equipment Utilized During Treatment: Gait belt Activity Tolerance: Patient tolerated treatment well Patient left: in chair;with call bell/phone within reach;with chair alarm set Nurse Communication: Mobility status PT Visit Diagnosis: History of falling (Z91.81);Unsteadiness on feet (R26.81);Pain Pain - Right/Left: Right Pain - part of body: Knee     Time: 9798-9211 PT Time Calculation (min) (ACUTE ONLY): 30 min  Charges:  $Gait Training: 8-22 mins $Therapeutic Activity: 8-22 mins                     Abran Richard, PT Acute Rehab Services Pager [phone] [facility] [place] 10/22/2020, 11:21 AM

## 2020-10-22 NOTE — Progress Notes (Signed)
Patient daughter called and left message that she is with transprt and leaving for facility my name and number was left with if if more information is needed. Arthor Captain LPN

## 2020-10-22 NOTE — TOC Transition Note (Signed)
Transition of Care [facility]) - CM/SW Discharge Note   Patient Details  Name: Morgan [name] MRN: [id number] Date of Birth: Feb 05, 1929  Transition of Care [facility]) CM/SW Contact:  Coralee Pesa, [place] Phone Number: 10/22/2020, 4:50 PM   Clinical Narrative:    Pt to be transported to [place] by [place]. Nurse to call report to [phone]. Going to Rm 1203 P Daughter requests phone call before she is transported.   Final next level of care: Skilled Nursing Facility Barriers to Discharge:  (Covid test results)   Patient Goals and CMS Choice   CMS Medicare.gov Compare Post Acute Care list provided to:: Patient Represenative (must comment) Choice offered to / list presented to : Adult Children  Discharge Placement              Patient chooses bed at: [facility] Patient to be transferred to facility by: [place] Name of family member notified: Joelene Millin Patient and family notified of of transfer: 10/22/20  Discharge Plan and Services In-house Referral: Clinical Social Work   Post Acute Care Choice: [place]                               Social Determinants of Health (SDOH) Interventions     Readmission Risk Interventions No flowsheet data found.

## 2020-10-22 NOTE — TOC Progression Note (Signed)
Transition of Care [facility]) - Progression Note    Patient Details  Name: Morgan [name] MRN: [id number] Date of Birth: [birth year]  Transition of Care [facility]) CM/SW [place] Phone Number: 10/22/2020, 11:26 AM  Clinical Narrative:     CSW spoke with pt's dtr this morning and she chose [facility] for SNF placement. [place] noted that they have a bed available today. MD put in covid order and will DC when ready. CSW will follow for DC needs.  Expected Discharge Plan: [place] Barriers to Discharge: Continued Medical Work up,SNF Pending bed offer  Expected Discharge Plan and Services Expected Discharge Plan: [place] In-house Referral: Clinical Social Work   Post Acute Care Choice: [place] Living arrangements for the past 2 months: Single Family Home Expected Discharge Date: 10/22/20                                     Social Determinants of Health (SDOH) Interventions    Readmission Risk Interventions No flowsheet data found.

## 2020-10-22 NOTE — Discharge Summary (Addendum)
Physician Discharge Summary  Morgan [name] JKK:[id number] DOB: 06-Jan-1929 DOA: 10/18/2020  PCP: Deland Pretty, MD  Admit date: 10/18/2020 Discharge date: 10/22/2020  Admitted From: Home Disposition: SNF   Recommendations for Outpatient Follow-up:  1. Follow up with PCP in 1-2 weeks 2. Please obtain BMP/CBC in one week 3. Remove scalp staples around 10/28/2020. 4. Outpatient follow up with ENT regarding Zenker diverticulum if needed (see below).  Home Health: N/A Equipment/Devices: Per SNF Discharge Condition: Stable CODE STATUS: Full Diet recommendation: Heart healthy, dysphagia 3  Brief/Interim Summary: Past medical history of HTN, HLD, hypothyroidism, CKD 3, TIA, breast cancer, hard of hearing.  Presents with complaints of a fall at home, found to have scalp laceration requiring staple repair and suspected to have had concussion. Imaging demonstrated no acute fractures. Incidentally found to have a foreign body-like structure in her Zenker's diverticulum though this was absent on recheck.   The patient will require SNF level of care at discharge prior to returning home.  Discharge Diagnoses:  Principal Problem:   Fall at home, initial encounter Active Problems:   Hypothyroidism   Hypertension   Hyperlipidemia   History of TIA (transient ischemic attack)   Chronic kidney disease (CKD), stage III (moderate) ([place])   Fall  Fall at home, concussion: No syncopal symptoms. CT head negative for intracranial injury though did show small extracranial hematoma. No fracture noted on C-spine, T-spine as well as L-spine CT scan. - Continue tylenol for pain control. Norco was been given as needed for pain control for back pain though this is improving and should not continue to be required. - Continue working with PT, remains SNF-appropriate. Patient unable to walk without assistance. Even with assistance is only able to walk 8 feet. Patient lives with family but spent 10 to 12 hours/day  alone on her own therefore remains at risk for poor outcome and therefore unsafe discharge, remaining at high risk for further falls.  Scalp laceration, hematoma: s/p staples. Avoiding anticoagulation due to this at this time.  - Remove staples ~3/24  History of Zenker's diverticulum: s/p surgery 2017 with Dr. [name]. CT T-spine is showing a linear structure in that area concerning for a retained ingested bone though this was absent on follow up imaging. The patient has no symptoms attributable to this.  - Previous hospitalist discussed with ENT who recommended follow up in the clinic.   Moderate size hiatal hernia. - Not on PPI and has no reflux symptoms. Patient remains at risk for aspiration secondary to this.  History of TIA: - Continue home plavix and statin.  CKD stage IIIb: Does not meet criteria for diagnosis of AKI. - Monitor BMP intermittently, avoid nephrotoxins  Hypothyroidism: - Continue synthroid.  Hypertension: - Continue losartan, lopressor.  Hyperlipidemia: - Continue simvastatin.  Discharge Instructions  Allergies as of 10/22/2020      Reactions   Strawberry Extract Hives      Medication List    TAKE these medications   CALCIUM PO Take 1 tablet by mouth daily.   clopidogrel 75 MG tablet Commonly known as: PLAVIX Take 75 mg by mouth daily.   levothyroxine 100 MCG tablet Commonly known as: SYNTHROID Take 100 mcg by mouth daily.   losartan 50 MG tablet Commonly known as: COZAAR Take 50 mg by mouth daily.   metoprolol tartrate 25 MG tablet Commonly known as: LOPRESSOR Take 25 mg by mouth daily.   multivitamin with minerals Tabs tablet Take 1 tablet by mouth daily.   simvastatin 10  MG tablet Commonly known as: ZOCOR Take 10 mg by mouth at bedtime.       Contact information for follow-up providers    Deland Pretty, MD. Schedule an appointment as soon as possible for a visit in 1 week(s).   Specialty: Internal Medicine Contact  information: [address] [place] [address] [phone]            Contact information for after-discharge care    Destination    [facility] Preferred SNF .   Service: Skilled Nursing Contact information: [place] [address] [phone]                 Allergies  Allergen Reactions  . Strawberry Extract Hives    Consultations:  None  Procedures/Studies: DG Neck Soft Tissue  Result Date: 10/19/2020 CLINICAL DATA:  History of Zenker diverticulum question foreign body EXAM: NECK SOFT TISSUES - 1+ VIEW COMPARISON:  None FINDINGS: Osseous demineralization with scattered degenerative disc and facet disease changes of cervical spine with lateral cervical flexion to the RIGHT. Prevertebral soft tissues normal thickness. Epiglottis and aryepiglottic folds unremarkable. No radiopaque foreign bodies identified. Lung apices clear. IMPRESSION: No radiopaque foreign bodies identified. Osseous demineralization with degenerative disc/facet disease changes of cervical spine with scoliosis. Electronically Signed   By: Lavonia Dana [name]   On: 10/19/2020 17:17   DG Chest 1 View  Result Date: 10/18/2020 CLINICAL DATA:  Fall, no loss of consciousness EXAM: CHEST  1 VIEW COMPARISON:  Portable exam 1434 hours compared to at 11:11 2016 FINDINGS: Normal heart size, mediastinal contours, and pulmonary vascularity. Atherosclerotic calcification aorta. Minimal RIGHT basilar atelectasis. Lungs otherwise clear. No pulmonary infiltrate, pleural effusion, or pneumothorax. Bones demineralized. Surgical clips in the axilla bilaterally at with probable BILATERAL prior mastectomy. IMPRESSION: Minimal RIGHT basilar atelectasis. Aortic Atherosclerosis (ICD10-I70.0). Electronically Signed   By: Lavonia Dana [name]   On: 10/18/2020 14:47   DG Lumbar Spine 2-3 Views  Result Date: 10/18/2020 CLINICAL DATA:  Fall with low back and hip pain EXAM: LUMBAR SPINE -  2-3 VIEW COMPARISON:  None. FINDINGS: This report assumes 5 non rib-bearing lumbar vertebrae. Lumbar vertebral body heights are preserved, with no fracture. Moderate multilevel lumbar degenerative disc disease, most prominent at L1-2. Mild 3 mm retrolisthesis at T12-L1, 3 mm retrolisthesis at L2-3 and 5 mm anterolisthesis at L4-5. Marked bilateral lower lumbar facet arthropathy. No aggressive appearing focal osseous lesions. IMPRESSION: 1. No lumbar spine fracture. 2. Moderate multilevel lumbar degenerative disc disease. 3. Mild multilevel lumbar spondylolisthesis. Electronically Signed   By: Ilona Sorrel [name]   On: 10/18/2020 16:41   DG Pelvis 1-2 Views  Result Date: 10/18/2020 CLINICAL DATA:  Fall with hip and low back pain EXAM: PELVIS - 1-2 VIEW COMPARISON:  None. FINDINGS: No pelvic fracture or diastasis. No evidence of hip dislocation on this single frontal view. No suspicious focal osseous lesions. Degenerative changes in the visualized lower lumbar spine. IMPRESSION: No pelvic fracture. Electronically Signed   By: Ilona Sorrel [name]   On: 10/18/2020 16:38   CT Head Wo Contrast  Result Date: 10/18/2020 CLINICAL DATA:  Level 2 fall on blood thinners. EXAM: CT HEAD WITHOUT CONTRAST CT CERVICAL SPINE WITHOUT CONTRAST TECHNIQUE: Multidetector CT imaging of the head and cervical spine was performed following the standard protocol without intravenous contrast. Multiplanar CT image reconstructions of the cervical spine were also generated. COMPARISON:  None. FINDINGS: CT HEAD FINDINGS Brain: No evidence of acute large vascular territory  the infarction, hemorrhage, hydrocephalus, extra-axial collection or mass lesion/mass effect. Mild age related global parenchymal volume loss. Mild burden of chronic white matter ischemic microvascular disease. Prominent bridging veins. Vascular: No hyperdense vessel. Atherosclerotic calcifications of the intracranial portions of the internal carotid arteries. Skull: Mild  benign hyperostosis frontalis. Negative for fracture or focal lesion. Sinuses/Orbits: Mucous retention cyst in the left frontal sinus. Otherwise the paranasal sinuses and mastoid air cells are predominantly clear. Other: Small extra calvarial hematoma overlying the left calvarial apex. Exam is slightly motion degraded. CT CERVICAL SPINE FINDINGS Alignment: Likely positional straightening of the normal cervical lordosis. No traumatic listhesis. Skull base and vertebrae: No acute fracture. Bony ankylosis of the posterior elements at C2-C5. High density sclerotic lesion within the spinous process at T1, likely a bone island. Soft tissues and spinal canal: No prevertebral fluid or swelling. No visible canal hematoma. Disc levels: Multilevel degenerative change of the cervical spine with disc space narrowing, disc osteophyte complex ease, and uncovertebral/facet hypertrophy Upper chest: Biapical pleuroparenchymal scarring. Other: None IMPRESSION: 1. Small extra calvarial hematoma overlying the left calvarial apex. No underlying calvarial fracture or acute intracranial abnormality. 2. No evidence of acute fracture or traumatic listhesis of the cervical spine. 3. Multilevel degenerative change of the cervical spine. Electronically Signed   By: Dahlia Bailiff MD   On: 10/18/2020 15:14   CT Cervical Spine Wo Contrast  Result Date: 10/18/2020 CLINICAL DATA:  Level 2 fall on blood thinners. EXAM: CT HEAD WITHOUT CONTRAST CT CERVICAL SPINE WITHOUT CONTRAST TECHNIQUE: Multidetector CT imaging of the head and cervical spine was performed following the standard protocol without intravenous contrast. Multiplanar CT image reconstructions of the cervical spine were also generated. COMPARISON:  None. FINDINGS: CT HEAD FINDINGS Brain: No evidence of acute large vascular territory the infarction, hemorrhage, hydrocephalus, extra-axial collection or mass lesion/mass effect. Mild age related global parenchymal volume loss. Mild burden  of chronic white matter ischemic microvascular disease. Prominent bridging veins. Vascular: No hyperdense vessel. Atherosclerotic calcifications of the intracranial portions of the internal carotid arteries. Skull: Mild benign hyperostosis frontalis. Negative for fracture or focal lesion. Sinuses/Orbits: Mucous retention cyst in the left frontal sinus. Otherwise the paranasal sinuses and mastoid air cells are predominantly clear. Other: Small extra calvarial hematoma overlying the left calvarial apex. Exam is slightly motion degraded. CT CERVICAL SPINE FINDINGS Alignment: Likely positional straightening of the normal cervical lordosis. No traumatic listhesis. Skull base and vertebrae: No acute fracture. Bony ankylosis of the posterior elements at C2-C5. High density sclerotic lesion within the spinous process at T1, likely a bone island. Soft tissues and spinal canal: No prevertebral fluid or swelling. No visible canal hematoma. Disc levels: Multilevel degenerative change of the cervical spine with disc space narrowing, disc osteophyte complex ease, and uncovertebral/facet hypertrophy Upper chest: Biapical pleuroparenchymal scarring. Other: None IMPRESSION: 1. Small extra calvarial hematoma overlying the left calvarial apex. No underlying calvarial fracture or acute intracranial abnormality. 2. No evidence of acute fracture or traumatic listhesis of the cervical spine. 3. Multilevel degenerative change of the cervical spine. Electronically Signed   By: Dahlia Bailiff MD   On: 10/18/2020 15:14   CT Thoracic Spine Wo Contrast  Result Date: 10/18/2020 CLINICAL DATA:  Spine fracture, thoracic, traumatic Fall with back pain. EXAM: CT THORACIC SPINE WITHOUT CONTRAST TECHNIQUE: Multidetector CT images of the thoracic were obtained using the standard protocol without intravenous contrast. COMPARISON:  None. FINDINGS: Alignment: Moderate dextroscoliotic curvature. Vertebrae: No acute fracture or focal pathologic process.  Paraspinal and  other soft tissues: Biapical pleuroparenchymal scarring. There is a moderate-sized hiatal hernia. There is a 19 mm linear high density structure in the cervical esophagus at the level of C7-T1, unclear if this is within a Zenker's diverticulum or the esophageal lumen. Disc levels: Disc space narrowing with endplate spurring at multiple levels in the mid lower thoracic spine. No high-grade canal stenosis. IMPRESSION: 1. No acute fracture of the thoracic spine. 2. Moderate dextroscoliotic curvature with multilevel degenerative disc disease. 3. Linear high-density 19 mm structure either within the cervical esophagus or a Zenker's diverticulum is suspicious for retained ingested bone. Recommend correlation for foreign body sensation. 4. Moderate-sized hiatal hernia. Electronically Signed   By: Keith Rake [name]   On: 10/18/2020 19:43   CT Lumbar Spine Wo Contrast  Result Date: 10/18/2020 CLINICAL DATA:  87 year old with back pain after fall. EXAM: CT LUMBAR SPINE WITHOUT CONTRAST TECHNIQUE: Multidetector CT imaging of the lumbar spine was performed without intravenous contrast administration. Multiplanar CT image reconstructions were also generated. COMPARISON:  Radiograph earlier today. FINDINGS: Segmentation: 5 lumbar type vertebrae. Alignment: 5 mm anterolisthesis of L4 on L5. 2 mm retrolisthesis of T12 on L1 and L1 on L2. Mild broad-based levo scoliotic curvature of the lumbar spine. Vertebrae: No acute fracture or focal pathologic process. Scattered Modic endplate changes related to degenerative disc disease. Paraspinal and other soft tissues: No acute or unexpected findings. Cholecystectomy. Aortic atherosclerosis. Scattered colonic diverticulosis. Disc levels: Disc space narrowing, endplate spurring, and vacuum phenomenon at all lumbar levels. There is facet hypertrophy at all levels, prominent involving L3-L4, L4-L5, and L5-S1. There is canal stenosis at L4-L5 which is multifactorial.  Bilateral neural foraminal stenosis at this level. IMPRESSION: 1. No acute fracture of the lumbar spine. 2. Multilevel degenerative disc disease and facet hypertrophy throughout the lumbar spine. Mild scoliosis. 3. Degenerative anterolisthesis of L4 on L5 with canal stenosis and bilateral neural foraminal stenosis at this level. Aortic Atherosclerosis (ICD10-I70.0). Electronically Signed   By: Keith Rake [name]   On: 10/18/2020 19:31   Subjective: Pain is improved, getting up with PT and eager to rehabilitate. Reading a book this morning. No bleeding from scalp wound. Ready to go.  Discharge Exam: Vitals:   10/22/20 0751 10/22/20 1026  BP: 118/68 118/68  Pulse: 79 79  Resp: 17   Temp: 97.9 F (36.6 C)   SpO2: 99%    General: Pt is alert, awake, not in acute distress Cardiovascular: RRR, S1/S2 +, no rubs, no gallops Respiratory: CTA bilaterally, no wheezing, no rhonchi Abdominal: Soft, NT, ND, bowel sounds + Extremities: No edema, no cyanosis Skin: Scalp laceration with well apposed skin edges, 8 staples.   Labs Basic Metabolic Panel: Recent Labs  Lab 10/18/20 1846 10/19/20 0313  NA 138 137  K 4.4 4.4  CL 104 104  CO2 25 25  GLUCOSE 133* 134*  BUN 22 21  CREATININE 1.16* 1.20*  CALCIUM 9.3 8.8*   CBC: Recent Labs  Lab 10/18/20 1846 10/19/20 0313  WBC 15.8* 10.3  NEUTROABS 13.7*  --   HGB 11.9* 10.5*  HCT 33.4* 30.8*  MCV 89.5 90.3  PLT 207 202   Urinalysis    Component Value Date/Time   COLORURINE YELLOW 10/18/2020 2006   APPEARANCEUR CLEAR 10/18/2020 2006   LABSPEC 1.015 10/18/2020 2006   PHURINE 5.0 10/18/2020 2006   GLUCOSEU NEGATIVE 10/18/2020 2006   HGBUR NEGATIVE 10/18/2020 2006   BILIRUBINUR NEGATIVE 10/18/2020 2006   KETONESUR 5 (A) 10/18/2020 2006   PROTEINUR NEGATIVE  10/18/2020 2006   NITRITE NEGATIVE 10/18/2020 2006   LEUKOCYTESUR NEGATIVE 10/18/2020 2006    Microbiology Recent Results (from the past 240 hour(s))  Resp Panel by RT-PCR  (Flu A&B, Covid) Nasopharyngeal Swab     Status: None   Collection Time: 10/18/20  6:46 PM   Specimen: Nasopharyngeal Swab; Nasopharyngeal(NP) swabs in vial transport medium  Result Value Ref Range Status   SARS Coronavirus 2 by RT PCR NEGATIVE NEGATIVE Final    Comment: (NOTE) SARS-CoV-2 target nucleic acids are NOT DETECTED.  The SARS-CoV-2 RNA is generally detectable in upper respiratory specimens during the acute phase of infection. The lowest concentration of SARS-CoV-2 viral copies this assay can detect is 138 copies/mL. A negative result does not preclude SARS-Cov-2 infection and should not be used as the sole basis for treatment or other patient management decisions. A negative result may occur with  improper specimen collection/handling, submission of specimen other than nasopharyngeal swab, presence of viral mutation(s) within the areas targeted by this assay, and inadequate number of viral copies(<138 copies/mL). A negative result must be combined with clinical observations, patient history, and epidemiological information. The expected result is Negative.  Fact Sheet for Patients:  EntrepreneurPulse.com.au  Fact Sheet for Healthcare Providers:  IncredibleEmployment.be  This test is no t yet approved or cleared by the [place] FDA and  has been authorized for detection and/or diagnosis of SARS-CoV-2 by FDA under an Emergency Use Authorization (EUA). This EUA will remain  in effect (meaning this test can be used) for the duration of the COVID-19 declaration under Section 564(b)(1) of the Act, 21 U.S.C.section 360bbb-3(b)(1), unless the authorization is terminated  or revoked sooner.       Influenza A by PCR NEGATIVE NEGATIVE Final   Influenza B by PCR NEGATIVE NEGATIVE Final    Comment: (NOTE) The Xpert Xpress SARS-CoV-2/FLU/RSV plus assay is intended as an aid in the diagnosis of influenza from Nasopharyngeal swab specimens  and should not be used as a sole basis for treatment. Nasal washings and aspirates are unacceptable for Xpert Xpress SARS-CoV-2/FLU/RSV testing.  Fact Sheet for Patients: EntrepreneurPulse.com.au  Fact Sheet for Healthcare Providers: IncredibleEmployment.be  This test is not yet approved or cleared by the [place] FDA and has been authorized for detection and/or diagnosis of SARS-CoV-2 by FDA under an Emergency Use Authorization (EUA). This EUA will remain in effect (meaning this test can be used) for the duration of the COVID-19 declaration under Section 564(b)(1) of the Act, 21 U.S.C. section 360bbb-3(b)(1), unless the authorization is terminated or revoked.  Performed at [facility], [place] [address], [place], [place] [address]     Time coordinating discharge: Approximately 40 minutes  Patrecia Pour, MD  [facility] 10/22/2020, 2:33 PM

## 2020-10-22 NOTE — Care Management Important Message (Signed)
Important Message  Patient Details  Name: JAYONA MCCAIG MRN: [id number] Date of Birth: 1928-09-02   Medicare Important Message Given:  Yes - Important Message mailed due to current National Emergency  Verbal consent obtained due to current National Emergency  Relationship to patient: Child Contact Name: Darden Palmer Call Date: 10/22/20  Time: 1109 Phone: [phone] Outcome: No Answer/Busy Important Message mailed to: Patient address on file    Delorse Lek 10/22/2020, 11:10 AM

## 2020-10-22 NOTE — Progress Notes (Signed)
Report called to Fabio Asa, nurse at [facility].  Discharge packet has been printed and will be taken via PTAR

## 2020-10-22 NOTE — Plan of Care (Signed)

## 2020-10-25 ENCOUNTER — Encounter: Payer: Self-pay | Admitting: Podiatry

## 2020-10-25 ENCOUNTER — Other Ambulatory Visit: Payer: Self-pay | Admitting: *Deleted

## 2020-10-25 NOTE — Patient Outreach (Signed)
Member screened for potential [facility] Care Management needs.  Communication sent to [facility] SW to inquire about transition plans.    Marthenia Rolling, MSN, RN,BSN [place] Acute Care Coordinator [phone] [facility]) [phone]  (Toll free office)

## 2020-10-26 ENCOUNTER — Other Ambulatory Visit: Payer: Self-pay | Admitting: *Deleted

## 2020-10-26 NOTE — Patient Outreach (Signed)
[place] Coordinator follow up.  Update received from [facility] indicating family meeting is scheduled for today. Not sure of transition plan at this time.   Will continue to follow transition plans and for potential [facility] Care Management needs while member resides in SNF.   Marthenia Rolling, MSN, RN,BSN [place] Acute Care Coordinator [phone] [facility]) [phone]  (Toll free office)

## 2020-11-03 ENCOUNTER — Other Ambulatory Visit: Payer: Self-pay | Admitting: *Deleted

## 2020-11-03 NOTE — Patient Outreach (Signed)
[facility] Post- Acute Care Coordinator follow up.   [name] resides in [facility].   Communication sent to [facility] SW to inquire about transition plans.   Will continue to follow transition plans and for potential [facility] Care Management needs while member resides in SNF.    Morgan Rolling, MSN, RN,BSN [place] Acute Care Coordinator [phone] [facility]) [phone]  (Toll free office)

## 2020-11-04 ENCOUNTER — Other Ambulatory Visit: Payer: Self-pay | Admitting: *Deleted

## 2020-11-04 NOTE — Patient Outreach (Signed)
[facility] Post-Acute care Coordinator follow up. Member screened for potential [facility] Care Management needs.  Update received from [place] indicating transition plan is to return home with daughter and son in law.   Will continue to follow for transition plans and potential [facility] Care Management needs while member resides in SNF.    Marthenia Rolling, MSN, RN,BSN [place] Acute Care Coordinator [phone] [facility]) [phone]  (Toll free office)

## 2020-11-16 ENCOUNTER — Other Ambulatory Visit: Payer: Self-pay | Admitting: *Deleted

## 2020-11-16 NOTE — Patient Outreach (Addendum)
[place] Coordinator follow up. Member screened for potential [facility] Care Management needs.  Ms. Macaluso resides in [facility]. Communication sent to facility SW to inquire about updated transition plans.   Update received from [facility] indicating meeting scheduled with daughter on Thursday to discuss member's progress. Transition date not set yet.  Will continue to follow.   Marthenia Rolling, MSN, RN,BSN [place] Acute Care Coordinator [phone] [facility]) [phone]  (Toll free office)

## 2020-11-30 ENCOUNTER — Other Ambulatory Visit: Payer: Self-pay | Admitting: *Deleted

## 2020-11-30 NOTE — Patient Outreach (Signed)
[facility] Post- Acute Care Coordinator follow up. Member screened for potential [facility] Care Management needs.  Ms. Shor resides in [facility]. Communication sent to facility SNF SW to inquire about transition plans.   Will continue to follow while member resides in SNF.     Marthenia Rolling, MSN, RN,BSN [place] Acute Care Coordinator [phone] [facility]) [phone]  (Toll free office)

## 2020-12-02 ENCOUNTER — Other Ambulatory Visit: Payer: Self-pay | Admitting: *Deleted

## 2020-12-02 NOTE — Patient Outreach (Signed)
[facility] Post- Acute Care Coordinator follow up. Member screened for potential [facility] Care Management needs.  Ms. Crockett resides in [facility]. Update received from [place] indicating member's transition plan is to return home daughter. No transition date set yet.   Will plan outreach closer to SNF discharge to discuss [place] Management services.    Marthenia Rolling, MSN, RN,BSN [place] Acute Care Coordinator [phone] [facility]) [phone]  (Toll free office)

## 2020-12-07 ENCOUNTER — Other Ambulatory Visit: Payer: Self-pay | Admitting: *Deleted

## 2020-12-07 NOTE — Patient Outreach (Signed)
[place] Coordinator follow up. Ms. Loh resides in [facility].   [facility] SN[facility] SW previously indicated transition plan for Ms. Granier is to return home with daughter.   Telephone call made to Ms. Reyez's daughter Darden Palmer [phone] to discuss [place] Management follow up. Joelene Millin states it is not a good time to talk. She is currently at work. Took writer's name and number to call back at a later time.   Will follow up with SNF SW on anticipated transition date.   Will continue to follow while member resides in SNF.    Marthenia Rolling, MSN, RN,BSN [place] Acute Care Coordinator [phone] [facility]) [phone]  (Toll free office)

## 2021-01-28 ENCOUNTER — Other Ambulatory Visit: Payer: Self-pay

## 2021-01-28 ENCOUNTER — Ambulatory Visit (INDEPENDENT_AMBULATORY_CARE_PROVIDER_SITE_OTHER): Payer: Medicare Other | Admitting: Podiatry

## 2021-01-28 DIAGNOSIS — L84 Corns and callosities: Secondary | ICD-10-CM

## 2021-01-28 DIAGNOSIS — B351 Tinea unguium: Secondary | ICD-10-CM

## 2021-01-28 DIAGNOSIS — M79675 Pain in left toe(s): Secondary | ICD-10-CM | POA: Diagnosis not present

## 2021-01-28 DIAGNOSIS — E1151 Type 2 diabetes mellitus with diabetic peripheral angiopathy without gangrene: Secondary | ICD-10-CM

## 2021-01-28 DIAGNOSIS — M79674 Pain in right toe(s): Secondary | ICD-10-CM | POA: Diagnosis not present

## 2021-01-30 ENCOUNTER — Encounter: Payer: Self-pay | Admitting: Podiatry

## 2021-01-30 DIAGNOSIS — E119 Type 2 diabetes mellitus without complications: Secondary | ICD-10-CM | POA: Insufficient documentation

## 2021-01-30 DIAGNOSIS — E7800 Pure hypercholesterolemia, unspecified: Secondary | ICD-10-CM | POA: Insufficient documentation

## 2021-01-30 DIAGNOSIS — E559 Vitamin D deficiency, unspecified: Secondary | ICD-10-CM | POA: Insufficient documentation

## 2021-01-30 DIAGNOSIS — M1189 Other specified crystal arthropathies, multiple sites: Secondary | ICD-10-CM | POA: Insufficient documentation

## 2021-01-30 NOTE — Progress Notes (Signed)
  Subjective:  Patient ID: Morgan [name], female    DOB: February 22, 1929,  MRN: [id number]  87 y.o. female presents with at risk foot care. Pt has h/o NIDDM with PAD and corn(s) both feet , callus(es) right foot and painful mycotic nails.  Pain interferes with ambulation. Aggravating factors include wearing enclosed shoe gear. Painful toenails interfere with ambulation. Aggravating factors include wearing enclosed shoe gear. Pain is relieved with periodic professional debridement. Painful corns and calluses are aggravated when weightbearing with and without shoegear. Pain is relieved with periodic professional debridement..    Patient's blood sugar was 115 mg/dl today.  PCP: Deland Pretty, MD and last visit was: 09/20/2020.  Patient states she suffered a fall in March and had a head injury. She was hospitalized for 5 days, then transferred to [facility] for 59 days for rehab. She is happy to be back home.  Review of Systems: Negative except as noted in the HPI.   Allergies  Allergen Reactions   Chocolate     Other reaction(s): Unknown   Other Swelling    Strawberries & chocolate ?- Facial swelling.   Shrimp (Diagnostic)     Other reaction(s): Unknown   Shrimp [Shellfish Allergy] Swelling    Shrimp & flounder-face swells on left side.   Strawberry (Diagnostic)     Other reaction(s): Unknown   Strawberry Extract Hives    Objective:  There were no vitals filed for this visit. Constitutional Patient is a pleasant 87 y.o. Caucasian femalemale in NAD. AAO x 3.  Vascular Capillary refill time to digits immediate b/l. Palpable DP pulse(s) b/l lower extremities Nonpalpable PT pulse(s) b/l lower extremities. Pedal hair absent. Lower extremity skin temperature gradient within normal limits. No cyanosis or clubbing noted.  Neurologic Normal speech. Protective sensation intact 5/5 intact bilaterally with 10g monofilament b/l. Vibratory sensation intact b/l.  Dermatologic Pedal skin with normal turgor,  texture and tone b/l lower extremities No open wounds b/l lower extremities No interdigital macerations b/l lower extremities Toenails 1-5 b/l elongated, discolored, dystrophic, thickened, crumbly with subungual debris and tenderness to dorsal palpation. Hyperkeratotic lesion(s) submet head 1 left foot.  No erythema, no edema, no drainage, no fluctuance. Distal corns left 3rd toe, right 4th toe diminished due to decreased weightbearing.  Orthopedic: Normal muscle strength 5/5 to all lower extremity muscle groups bilaterally. No pain crepitus or joint limitation noted with ROM b/l. Hallux valgus with bunion deformity noted b/l lower extremities. Hammertoe(s) noted to the 2-5 bilaterally.   Assessment:   1. Pain due to onychomycosis of toenails of both feet   2. Callus   3. Type II diabetes mellitus with peripheral circulatory disorder [facility])    Plan:  Patient was evaluated and treated and all questions answered.  Onychomycosis with pain -Nails palliatively debridement as below. -Educated on self-care  Procedure: Nail Debridement Rationale: Pain Type of Debridement: manual, sharp debridement. Instrumentation: Nail nipper, rotary burr. Number of Nails: 10  -Examined patient. -Patient to continue soft, supportive shoe gear daily. -Toenails 1-5 b/l were debrided in length and girth with sterile nail nippers and dremel without iatrogenic bleeding.  -Callus(es) submet head 1 left foot pared utilizing sterile scalpel blade without complication or incident. Total number debrided =1. -Patient to report any pedal injuries to medical professional immediately. -Patient/POA to call should there be question/concern in the interim.  Return in about 3 months (around 04/30/2021).  Marzetta Board, DPM

## 2021-03-21 IMAGING — CT CT HEAD W/O CM
4 series · 15 of 47 positions shown, 17 images · non-contrast
Comparison: None.

CLINICAL DATA: Level 2 fall on blood thinners.

EXAM:
CT HEAD WITHOUT CONTRAST
CT CERVICAL SPINE WITHOUT CONTRAST
TECHNIQUE: Multidetector CT imaging of the head and cervical spine was
performed following the standard protocol without intravenous
contrast. Multiplanar CT image reconstructions of the cervical spine
were also generated.

[Series 3: head without · axial · non-contrast · 0.43mm/px · z∈[+1387,+1507]mm · 7 of 33 slices shown, 9 images]
[im 5/33  brain]
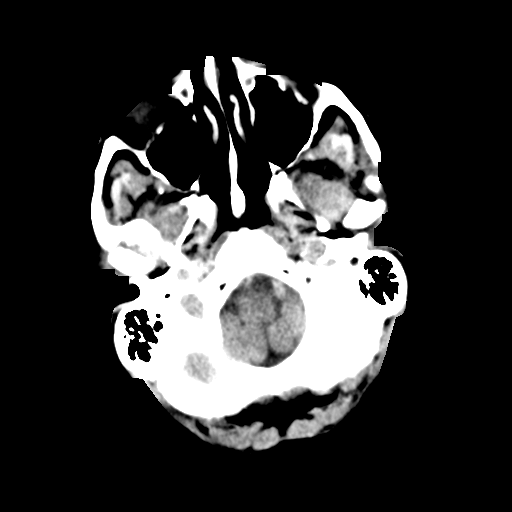
[im 5/33  bone]
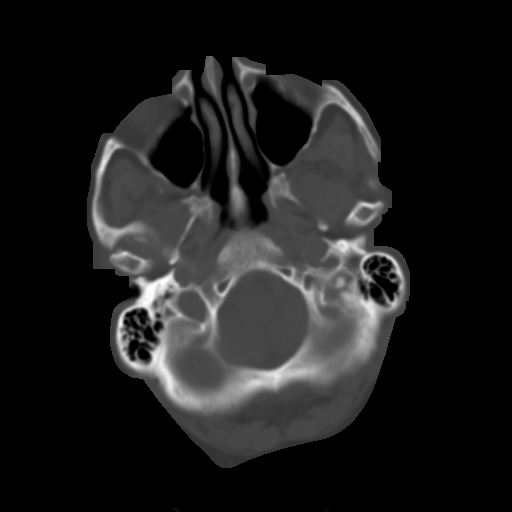
[im 9/33  brain]
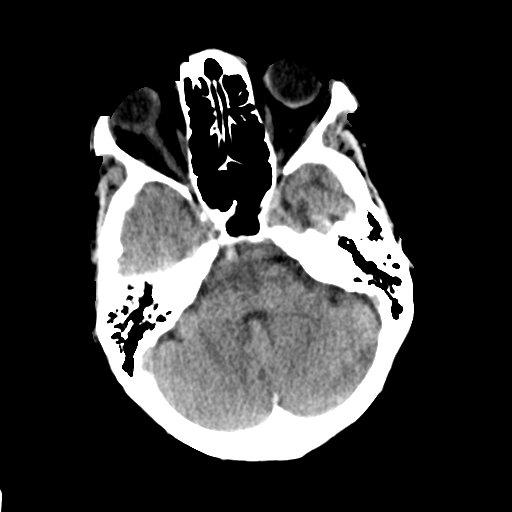
[im 13/33  brain]
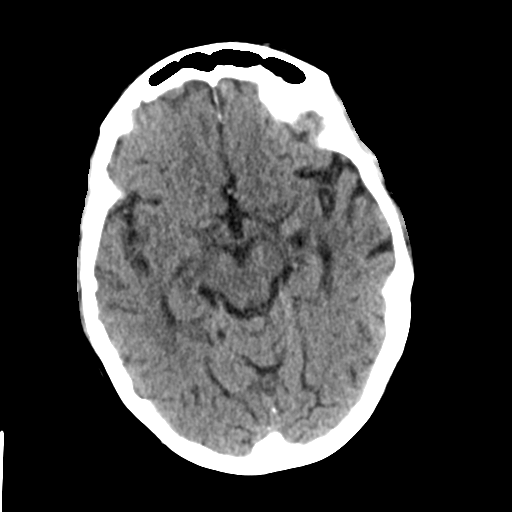
[im 17/33  brain]
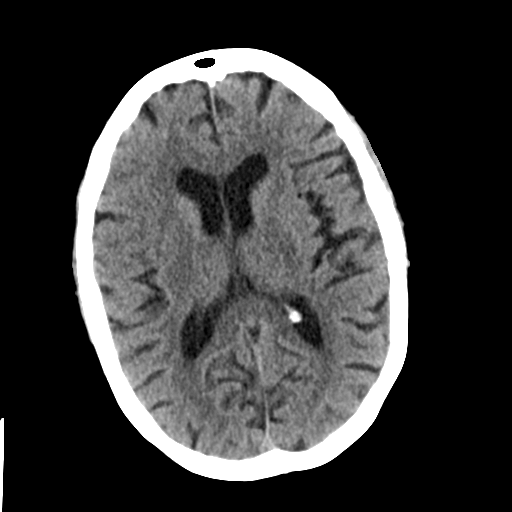
[im 21/33  brain]
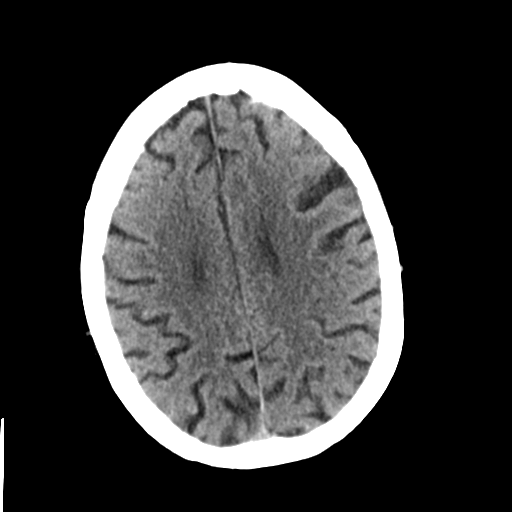
[im 21/33  bone]
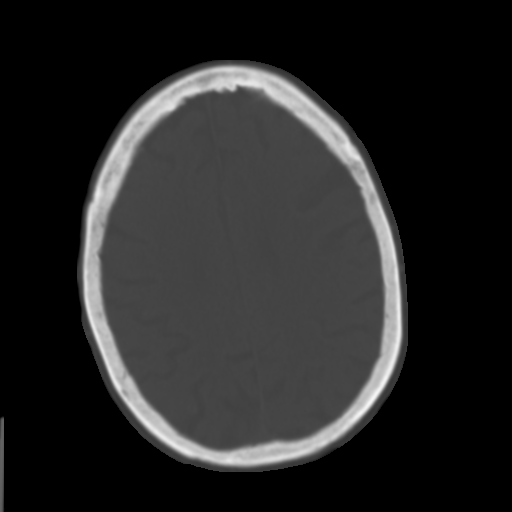
[im 25/33  brain]
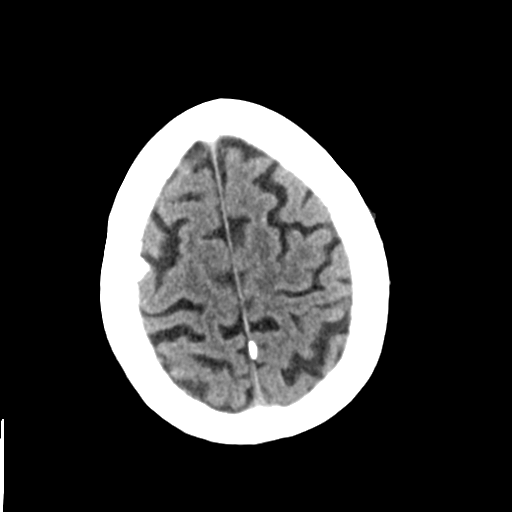
[im 29/33  brain]
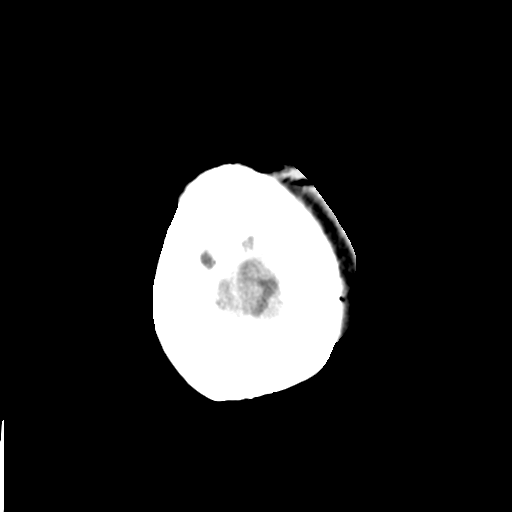

[Series 4: head bone · axial · 0.43mm/px · z∈[+1383,+1399]mm · 2 of 82 slices shown]
[im 9/82  bone]
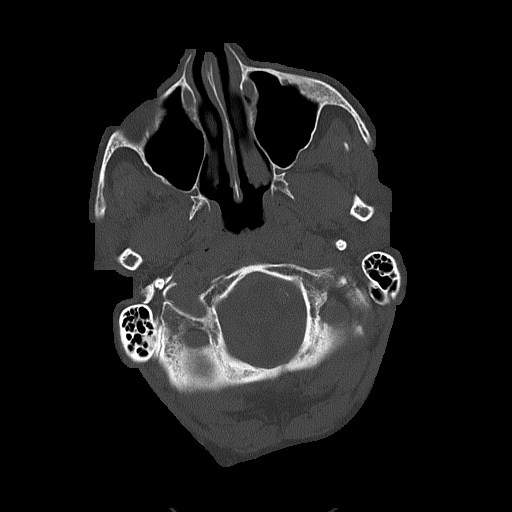
[im 17/82  bone]
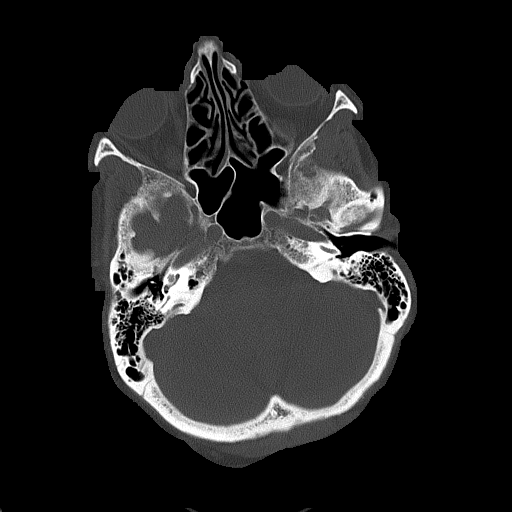

[Series 5: head without cor · coronal · non-contrast · 0.32mm/px · 3 of 73 slices shown]
[im 25/73  brain]
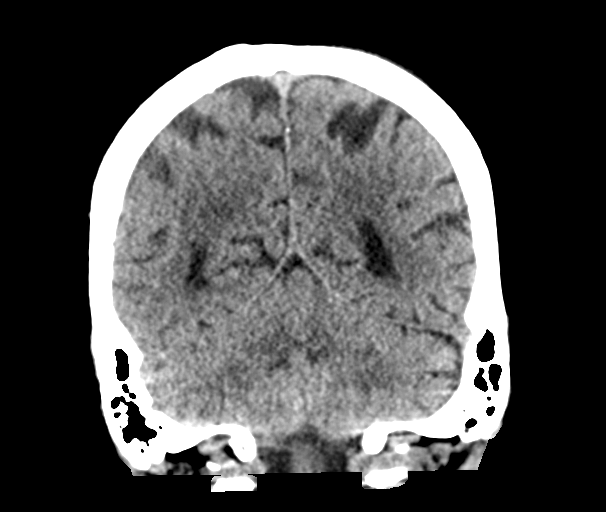
[im 33/73  brain]
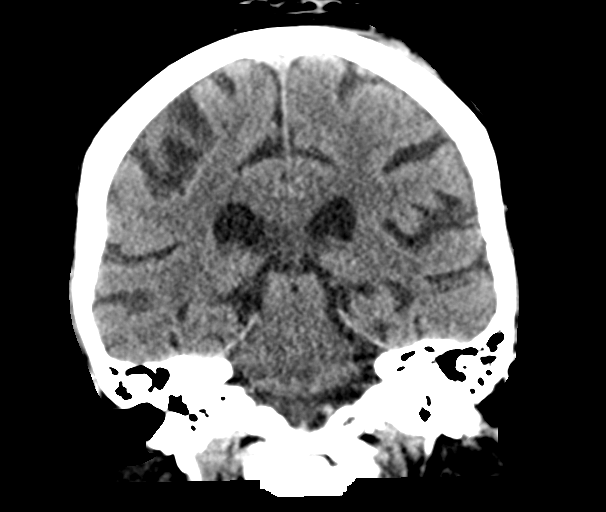
[im 41/73  brain]
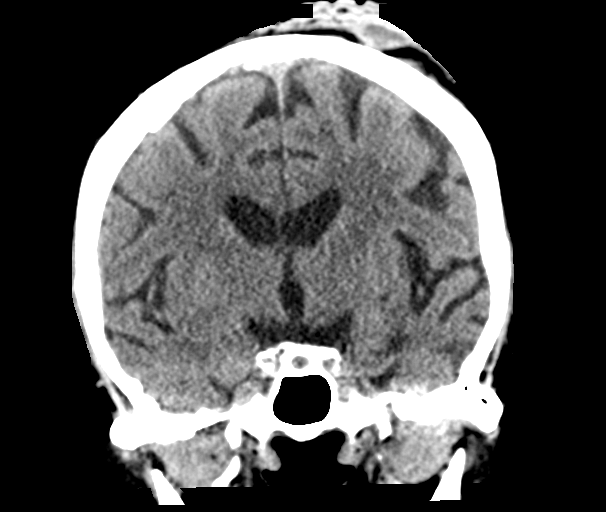

[Series 6: head without sag · sagittal · non-contrast · 0.32mm/px · 3 of 62 slices shown]
[im 21/62  brain]
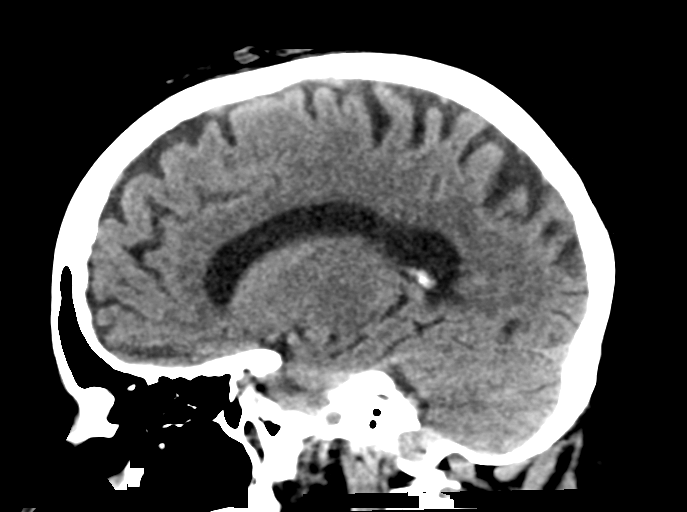
[im 31/62  brain]
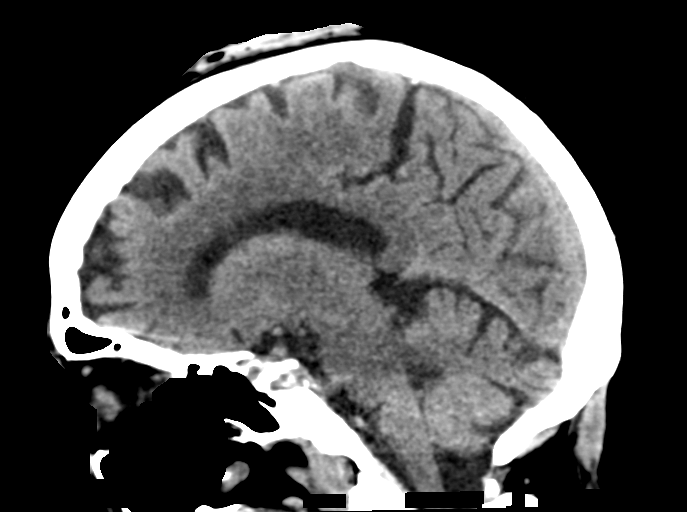
[im 41/62  brain]
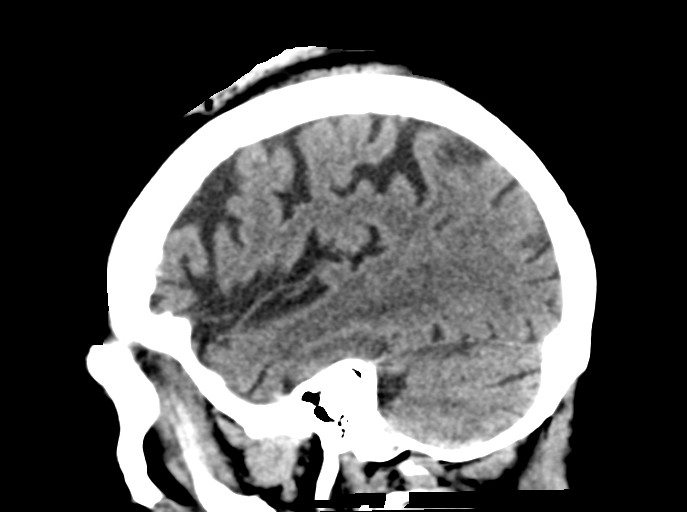

[15 of 47 positions shown; findings below may reference images not displayed]

FINDINGS: CT HEAD FINDINGS

Brain: No evidence of acute large vascular territory the infarction,
hemorrhage, hydrocephalus, extra-axial collection or mass
lesion/mass effect. Mild age related global parenchymal volume loss.
Mild burden of chronic white matter ischemic microvascular disease.
Prominent bridging veins.

Vascular: No hyperdense vessel. Atherosclerotic calcifications of
the intracranial portions of the internal carotid arteries.

Skull: Mild benign hyperostosis frontalis. Negative for fracture or
focal lesion.

Sinuses/Orbits: Mucous retention cyst in the left frontal sinus.
Otherwise the paranasal sinuses and mastoid air cells are
predominantly clear.

Other: Small extra calvarial hematoma overlying the left calvarial
apex.

Exam is slightly motion degraded.

CT CERVICAL SPINE FINDINGS

Alignment: Likely positional straightening of the normal cervical
lordosis. No traumatic listhesis.

Skull base and vertebrae: No acute fracture. Bony ankylosis of the
posterior elements at C2-C5. High density sclerotic lesion within
the spinous process at T1, likely a bone island.

Soft tissues and spinal canal: No prevertebral fluid or swelling. No
visible canal hematoma.

Disc levels: Multilevel degenerative change of the cervical spine
with disc space narrowing, disc osteophyte complex ease, and
uncovertebral/facet hypertrophy

Upper chest: Biapical pleuroparenchymal scarring.

Other: None
IMPRESSION: 1. Small extra calvarial hematoma overlying the left calvarial apex.
No underlying calvarial fracture or acute intracranial abnormality.
2. No evidence of acute fracture or traumatic listhesis of the
cervical spine.
3. Multilevel degenerative change of the cervical spine.

## 2021-03-22 IMAGING — DX DG NECK SOFT TISSUE
2 series · 3 of 3 positions shown · non-contrast
Comparison: None

CLINICAL DATA: History of Zenker diverticulum question foreign body

EXAM:
NECK SOFT TISSUES - 1+ VIEW

[Series 1: neck lat · 0.14mm/px · 2 of 2 slices shown]
[im 1/2]
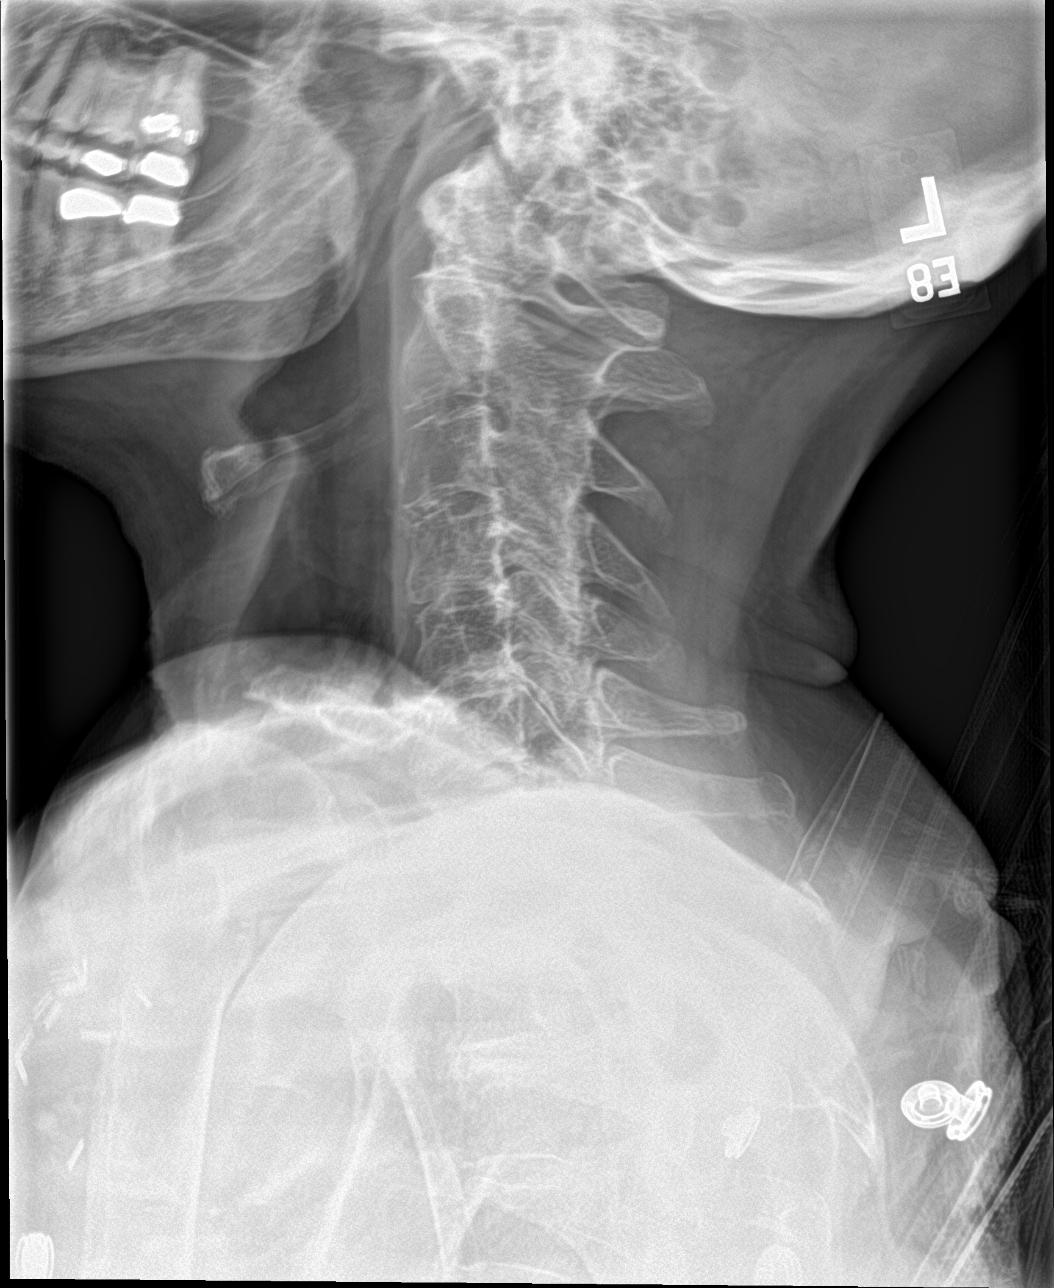
[im 2/2]
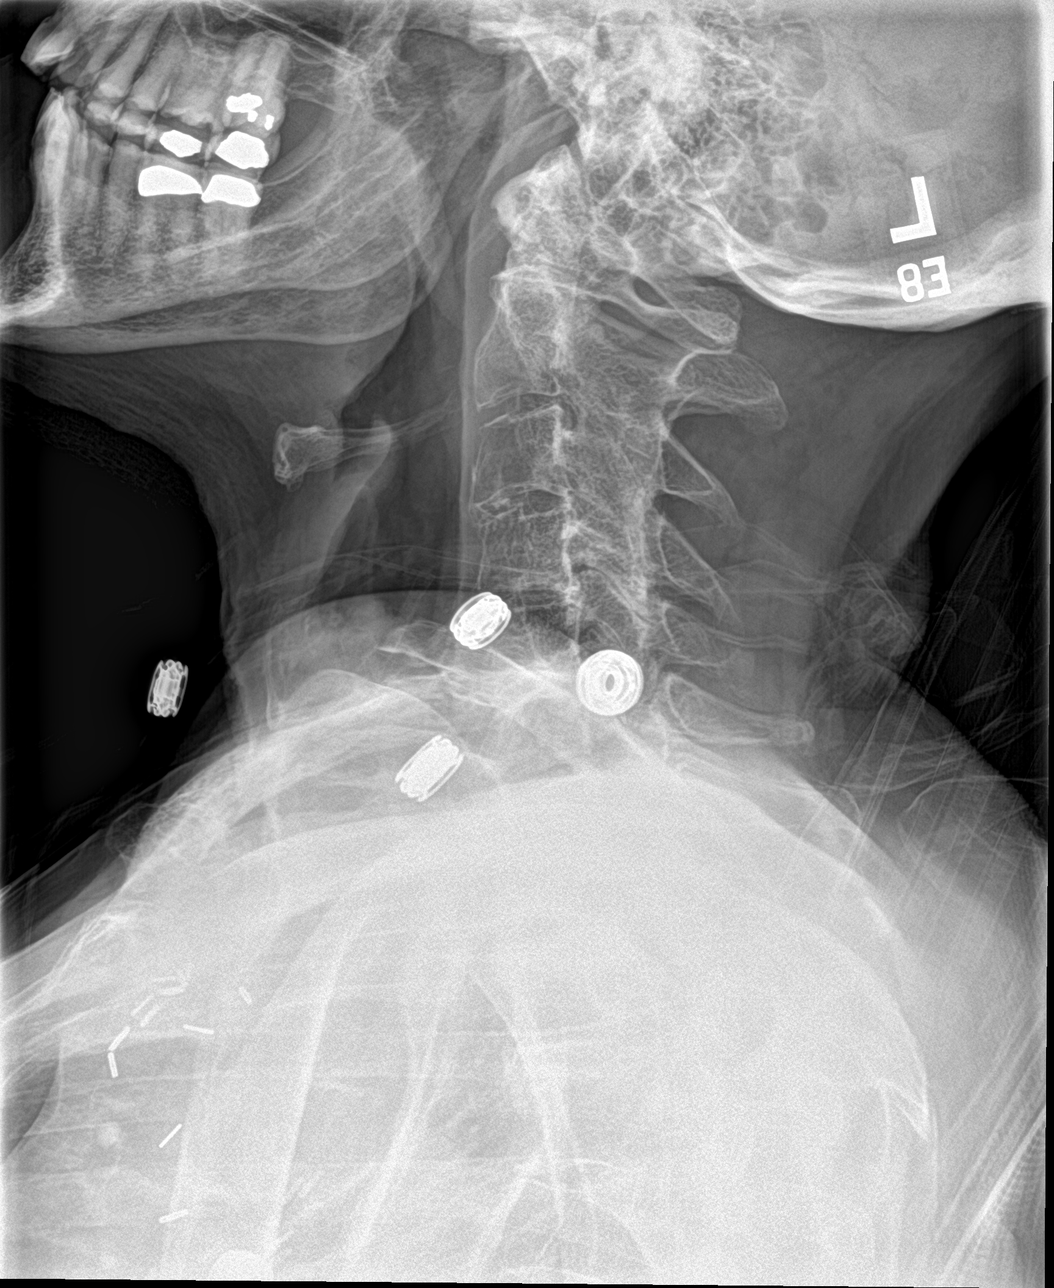

[neck ap]
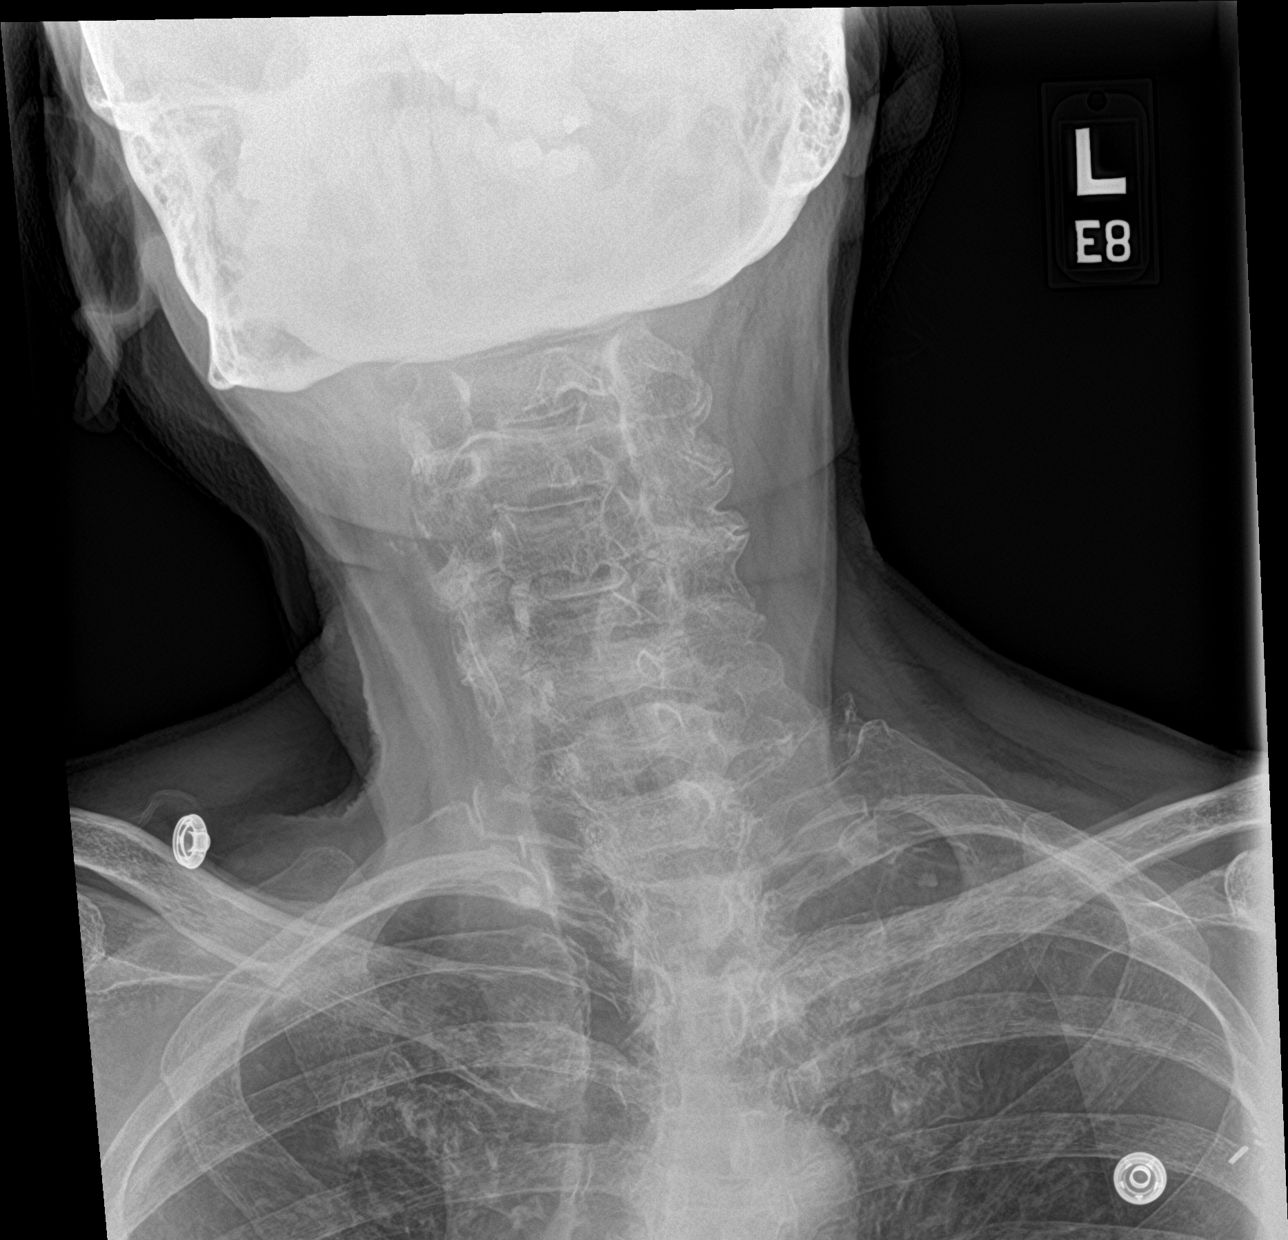

[3 of 3 positions shown; findings below may reference images not displayed]

FINDINGS: Osseous demineralization with scattered degenerative disc and facet
disease changes of cervical spine with lateral cervical flexion to
the RIGHT.

Prevertebral soft tissues normal thickness.

Epiglottis and aryepiglottic folds unremarkable.

No radiopaque foreign bodies identified.

Lung apices clear.
IMPRESSION: No radiopaque foreign bodies identified.

Osseous demineralization with degenerative disc/facet disease
changes of cervical spine with scoliosis.

## 2021-05-11 ENCOUNTER — Ambulatory Visit: Payer: Medicare Other | Admitting: Podiatry

## 2021-06-03 ENCOUNTER — Other Ambulatory Visit: Payer: Self-pay

## 2021-06-03 ENCOUNTER — Encounter: Payer: Self-pay | Admitting: Podiatry

## 2021-06-03 ENCOUNTER — Ambulatory Visit (INDEPENDENT_AMBULATORY_CARE_PROVIDER_SITE_OTHER): Payer: Medicare Other | Admitting: Podiatry

## 2021-06-03 DIAGNOSIS — M79675 Pain in left toe(s): Secondary | ICD-10-CM | POA: Diagnosis not present

## 2021-06-03 DIAGNOSIS — M2041 Other hammer toe(s) (acquired), right foot: Secondary | ICD-10-CM

## 2021-06-03 DIAGNOSIS — B351 Tinea unguium: Secondary | ICD-10-CM | POA: Diagnosis not present

## 2021-06-03 DIAGNOSIS — M2011 Hallux valgus (acquired), right foot: Secondary | ICD-10-CM

## 2021-06-03 DIAGNOSIS — E1151 Type 2 diabetes mellitus with diabetic peripheral angiopathy without gangrene: Secondary | ICD-10-CM | POA: Diagnosis not present

## 2021-06-03 DIAGNOSIS — M79674 Pain in right toe(s): Secondary | ICD-10-CM

## 2021-06-03 DIAGNOSIS — M2012 Hallux valgus (acquired), left foot: Secondary | ICD-10-CM

## 2021-06-03 DIAGNOSIS — M2042 Other hammer toe(s) (acquired), left foot: Secondary | ICD-10-CM

## 2021-06-03 NOTE — Progress Notes (Signed)
This patient returns to my office for at risk foot care.  This patient requires this care by a professional since this patient will be at risk due to having diabetes. She presents to the office with her daughter.  This patient is unable to cut nails herself since the patient cannot reach her nails.These nails are painful walking and wearing shoes.  This patient presents for at risk foot care today.  General Appearance  Alert, conversant and in no acute stress.  Vascular  Dorsalis pedis and posterior tibial  pulses are weakly  palpable  bilaterally.  Capillary return is within normal limits  bilaterally. Temperature is within normal limits  bilaterally.  Neurologic  Senn-Weinstein monofilament wire test within normal limits  bilaterally. Muscle power within normal limits bilaterally.  Nails Thick disfigured discolored nails with subungual debris  from hallux to fifth toes bilaterally. No evidence of bacterial infection or drainage bilaterally.  Orthopedic  No limitations of motion  feet .  No crepitus or effusions noted.  No bony pathology or digital deformities noted.  Skin  normotropic skin with no porokeratosis noted bilaterally.  No signs of infections or ulcers noted.     Onychomycosis  Pain in right toes  Pain in left toes  Consent was obtained for treatment procedures.   Mechanical debridement of nails 1-5  bilaterally performed with a nail nipper.  Filed with dremel without incident.    Return office visit  3 months                    Told patient to return for periodic foot care and evaluation due to potential at risk complications.   Gardiner Barefoot DPM

## 2021-09-14 ENCOUNTER — Encounter: Payer: Self-pay | Admitting: Podiatry

## 2021-09-14 ENCOUNTER — Ambulatory Visit (INDEPENDENT_AMBULATORY_CARE_PROVIDER_SITE_OTHER): Payer: Medicare Other | Admitting: Podiatry

## 2021-09-14 ENCOUNTER — Other Ambulatory Visit: Payer: Self-pay

## 2021-09-14 DIAGNOSIS — E1151 Type 2 diabetes mellitus with diabetic peripheral angiopathy without gangrene: Secondary | ICD-10-CM | POA: Diagnosis not present

## 2021-09-14 DIAGNOSIS — L84 Corns and callosities: Secondary | ICD-10-CM

## 2021-09-14 DIAGNOSIS — B351 Tinea unguium: Secondary | ICD-10-CM | POA: Diagnosis not present

## 2021-09-14 DIAGNOSIS — M79675 Pain in left toe(s): Secondary | ICD-10-CM | POA: Diagnosis not present

## 2021-09-14 DIAGNOSIS — G8929 Other chronic pain: Secondary | ICD-10-CM | POA: Insufficient documentation

## 2021-09-14 DIAGNOSIS — R413 Other amnesia: Secondary | ICD-10-CM | POA: Insufficient documentation

## 2021-09-14 DIAGNOSIS — M79674 Pain in right toe(s): Secondary | ICD-10-CM

## 2021-09-19 NOTE — Progress Notes (Signed)
Subjective: Morgan [name] is a 88 y.o. female patient seen today for follow up of  at risk foot care. Pt has h/o NIDDM with PAD and painful elongated mycotic toenails 1-5 bilaterally which are tender when wearing enclosed shoe gear. Pain is relieved with periodic professional debridement..   Patient does not routinely monitor his/her blood glucose.  New problem(s)/concern(s) today: None    Her daughter is present on today's visit.  PCP is Deland Pretty, MD. Last visit was: 04/20/2021.  Allergies  Allergen Reactions   Chocolate     Other reaction(s): Unknown   Iodinated Contrast Media     Other reaction(s): Unknown   Other Swelling    Strawberries & chocolate ?- Facial swelling.   Shellfish Allergy Swelling    Shrimp & flounder-face swells on left side. Other reaction(s): Unknown   Shrimp (Diagnostic)     Other reaction(s): Unknown   Strawberry (Diagnostic)     Other reaction(s): Unknown   Strawberry Extract Hives    Other reaction(s): Unknown    Objective: Physical Exam  General: Patient is a pleasant 88 y.o. Caucasian female WD, WN in NAD. AAO x 3.   Neurovascular Examination: CFT <3 seconds b/l LE. Palpable DP pulse(s) b/l LE. Diminished PT pulse(s) b/l LE. Pedal hair absent. No pain with calf compression b/l. Lower extremity skin temperature gradient within normal limits. No edema noted b/l LE. No cyanosis or clubbing noted b/l LE.  Protective sensation intact 5/5 intact bilaterally with 10g monofilament b/l. Vibratory sensation intact b/l.  Dermatological:  Pedal skin thin and atrophic b/l LE. No open wounds b/l LE. No interdigital macerations noted b/l LE. Toenails 1-5 bilaterally elongated, discolored, dystrophic, thickened, and crumbly with subungual debris and tenderness to dorsal palpation. Hyperkeratotic lesion(s) submet head 1 left foot.  No erythema, no edema, no drainage, no fluctuance.  Musculoskeletal:  Muscle strength 5/5 to all lower extremity muscle  groups bilaterally. HAV with bunion deformity noted b/l LE. Hammertoe deformity noted 2-5 b/l.  Assessment: 1. Pain due to onychomycosis of toenails of both feet   2. Callus   3. Type II diabetes mellitus with peripheral circulatory disorder [facility])    Plan: Patient was evaluated and treated and all questions answered. Consent given for treatment as described below: -No new findings. No new orders. -Mycotic toenails 1-5 bilaterally were debrided in length and girth with sterile nail nippers and dremel without incident. -Callus(es) submet head 1 left foot pared utilizing sterile scalpel blade without complication or incident. Total number debrided =1. -Patient/POA to call should there be question/concern in the interim.  Return in about 3 months (around 12/12/2021).  Marzetta Board, DPM

## 2021-11-23 DIAGNOSIS — M1120 Other chondrocalcinosis, unspecified site: Secondary | ICD-10-CM | POA: Insufficient documentation

## 2021-11-23 DIAGNOSIS — G459 Transient cerebral ischemic attack, unspecified: Secondary | ICD-10-CM | POA: Insufficient documentation

## 2021-12-16 ENCOUNTER — Encounter: Payer: Self-pay | Admitting: Podiatry

## 2021-12-16 ENCOUNTER — Ambulatory Visit (INDEPENDENT_AMBULATORY_CARE_PROVIDER_SITE_OTHER): Payer: Medicare Other | Admitting: Podiatry

## 2021-12-16 DIAGNOSIS — M79675 Pain in left toe(s): Secondary | ICD-10-CM

## 2021-12-16 DIAGNOSIS — E1151 Type 2 diabetes mellitus with diabetic peripheral angiopathy without gangrene: Secondary | ICD-10-CM

## 2021-12-16 DIAGNOSIS — B351 Tinea unguium: Secondary | ICD-10-CM

## 2021-12-16 DIAGNOSIS — L84 Corns and callosities: Secondary | ICD-10-CM | POA: Diagnosis not present

## 2021-12-16 DIAGNOSIS — M79674 Pain in right toe(s): Secondary | ICD-10-CM

## 2021-12-24 NOTE — Progress Notes (Signed)
  Subjective:  Patient ID: Morgan [name], female    DOB: 10-Mar-1929,  MRN: [id number]  Morgan [name] presents to clinic today for at risk foot care. Pt has h/o NIDDM with PAD and callus(es) left lower extremity and painful thick toenails that are difficult to trim. Painful toenails interfere with ambulation. Aggravating factors include wearing enclosed shoe gear. Pain is relieved with periodic professional debridement. Painful calluses are aggravated when weightbearing with and without shoegear. Pain is relieved with periodic professional debridement.  Patient states blood glucose was 107 mg/dl today.  Last known HgA1c was 6.3%.  New problem(s): None.   PCP is Morgan Pretty, MD , and last visit was last week.  Allergies  Allergen Reactions   Chocolate     Other reaction(s): Unknown   Iodinated Contrast Media     Other reaction(s): Unknown   Other Swelling    Strawberries & chocolate ?- Facial swelling.   Shellfish Allergy Swelling    Shrimp & flounder-face swells on left side. Other reaction(s): Unknown   Shrimp (Diagnostic)     Other reaction(s): Unknown   Strawberry (Diagnostic)     Other reaction(s): Unknown   Strawberry Extract Hives    Other reaction(s): Unknown    Review of Systems: Negative except as noted in the HPI.  Objective: No changes noted in today's physical examination. General: Patient is a pleasant 88 y.o. Caucasian female WD, WN in NAD. AAO x 3.   Neurovascular Examination: CFT <3 seconds b/l LE. Palpable DP pulse(s) b/l LE. Diminished PT pulse(s) b/l LE. Pedal hair absent. No pain with calf compression b/l. Lower extremity skin temperature gradient within normal limits. No edema noted b/l LE. No cyanosis or clubbing noted b/l LE.  Protective sensation intact 5/5 intact bilaterally with 10g monofilament b/l. Vibratory sensation intact b/l.  Dermatological:  Pedal skin thin and atrophic b/l LE. No open wounds b/l LE. No interdigital macerations noted b/l  LE. Toenails 1-5 bilaterally elongated, discolored, dystrophic, thickened, and crumbly with subungual debris and tenderness to dorsal palpation. Hyperkeratotic lesion(s) submet head 1 left foot.  No erythema, no edema, no drainage, no fluctuance.  Musculoskeletal:  Muscle strength 5/5 to all lower extremity muscle groups bilaterally. HAV with bunion deformity noted b/l LE. Hammertoe deformity noted 2-5 b/l.  Assessment/Plan: 1. Pain due to onychomycosis of toenails of both feet   2. Callus   3. Type II diabetes mellitus with peripheral circulatory disorder (HCC)      -Examined patient. -Patient to continue soft, supportive shoe gear daily. -Toenails 1-5 b/l were debrided in length and girth with sterile nail nippers and dremel without iatrogenic bleeding.  -Callus(es) submet head 1 left foot pared utilizing sterile scalpel blade without complication or incident. Total number debrided =1. -Patient/POA to call should there be question/concern in the interim.   Return in about 3 months (around 03/18/2022).  Marzetta Board, DPM

## 2022-03-22 ENCOUNTER — Encounter: Payer: Self-pay | Admitting: Podiatry

## 2022-03-22 ENCOUNTER — Ambulatory Visit (INDEPENDENT_AMBULATORY_CARE_PROVIDER_SITE_OTHER): Payer: Medicare Other | Admitting: Podiatry

## 2022-03-22 DIAGNOSIS — M79675 Pain in left toe(s): Secondary | ICD-10-CM | POA: Diagnosis not present

## 2022-03-22 DIAGNOSIS — M79674 Pain in right toe(s): Secondary | ICD-10-CM | POA: Diagnosis not present

## 2022-03-22 DIAGNOSIS — E1151 Type 2 diabetes mellitus with diabetic peripheral angiopathy without gangrene: Secondary | ICD-10-CM | POA: Diagnosis not present

## 2022-03-22 DIAGNOSIS — B351 Tinea unguium: Secondary | ICD-10-CM | POA: Diagnosis not present

## 2022-03-22 DIAGNOSIS — L84 Corns and callosities: Secondary | ICD-10-CM | POA: Diagnosis not present

## 2022-03-26 NOTE — Progress Notes (Signed)
  Subjective:  Patient ID: Morgan [name], female    DOB: 1929-05-22,  MRN: [id number]  Morgan [name] presents to clinic today for at risk foot care. Pt has h/o NIDDM with PAD and corn(s) R 5th toe, callus(es) b/l lower extremities and painful mycotic nails.  Pain interferes with ambulation. Aggravating factors include wearing enclosed shoe gear. Painful toenails interfere with ambulation. Aggravating factors include wearing enclosed shoe gear. Pain is relieved with periodic professional debridement. Painful corns and calluses are aggravated when weightbearing with and without shoegear. Pain is relieved with periodic professional debridement.  Patient states blood glucose was 94 mg/dl today.  Last known HgA1c was unknown.    New problem(s): None.   PCP is Cathlean Sauer, MD , and last visit was  Dec 29, 2021  Allergies  Allergen Reactions   Chocolate     Other reaction(s): Unknown   Iodinated Contrast Media     Other reaction(s): Unknown   Other Swelling    Strawberries & chocolate ?- Facial swelling.   Shellfish Allergy Swelling    Shrimp & flounder-face swells on left side. Other reaction(s): Unknown   Shrimp (Diagnostic)     Other reaction(s): Unknown   Strawberry (Diagnostic)     Other reaction(s): Unknown   Strawberry Extract Hives    Other reaction(s): Unknown    Review of Systems: Negative except as noted in the HPI.  Objective: No changes noted in today's physical examination. Vascular Examination: CFT <3 seconds b/l. DP pulses faintly palpable b/l. PT pulses diminished b/l. Digital hair absent. Skin temperature gradient warm to warm b/l. No ischemia or gangrene. No cyanosis or clubbing noted b/l.    Neurological Examination: Sensation grossly intact b/l with 10 gram monofilament. Vibratory sensation intact b/l.   Dermatological Examination: Pedal skin thin, shiny and atrophic b/l LE. No open wounds b/l LE. No interdigital macerations noted b/l LE. Toenails 1-5 b/l  elongated, discolored, dystrophic, thickened, crumbly with subungual debris and tenderness to dorsal palpation. Hyperkeratotic lesion(s) dorsal PIPJ of R 5th toe and submet head 1 b/l.  No erythema, no edema, no drainage, no fluctuance.  Musculoskeletal Examination: Muscle strength 5/5 to b/l LE. HAV with bunion bilaterally and hammertoes 2-5 b/l.  Radiographs: None  Assessment/Plan: 1. Pain due to onychomycosis of toenails of both feet   2. Corns and callosities   3. Type II diabetes mellitus with peripheral circulatory disorder [facility])   -Patient was evaluated and treated. All patient's and/or POA's questions/concerns answered on today's visit. -Continue foot and shoe inspections daily. Monitor blood glucose per PCP/Endocrinologist's recommendations. -Mycotic toenails 1-5 bilaterally were debrided in length and girth with sterile nail nippers and dremel without incident. -Corn(s) R 5th toe and callus(es) submet head 1 b/l were pared utilizing sterile scalpel blade without incident. Total number debrided =3. -Patient/POA to call should there be question/concern in the interim.   Return in about 3 months (around 06/22/2022).  Marzetta Board, DPM

## 2022-06-19 ENCOUNTER — Ambulatory Visit (INDEPENDENT_AMBULATORY_CARE_PROVIDER_SITE_OTHER): Payer: Medicare Other | Admitting: Podiatry

## 2022-06-19 DIAGNOSIS — M2042 Other hammer toe(s) (acquired), left foot: Secondary | ICD-10-CM

## 2022-06-19 DIAGNOSIS — M2041 Other hammer toe(s) (acquired), right foot: Secondary | ICD-10-CM | POA: Diagnosis not present

## 2022-06-19 DIAGNOSIS — M79674 Pain in right toe(s): Secondary | ICD-10-CM | POA: Diagnosis not present

## 2022-06-19 DIAGNOSIS — M2011 Hallux valgus (acquired), right foot: Secondary | ICD-10-CM | POA: Diagnosis not present

## 2022-06-19 DIAGNOSIS — E1151 Type 2 diabetes mellitus with diabetic peripheral angiopathy without gangrene: Secondary | ICD-10-CM

## 2022-06-19 DIAGNOSIS — E119 Type 2 diabetes mellitus without complications: Secondary | ICD-10-CM

## 2022-06-19 DIAGNOSIS — M79675 Pain in left toe(s): Secondary | ICD-10-CM | POA: Diagnosis not present

## 2022-06-19 DIAGNOSIS — L84 Corns and callosities: Secondary | ICD-10-CM

## 2022-06-19 DIAGNOSIS — M2012 Hallux valgus (acquired), left foot: Secondary | ICD-10-CM

## 2022-06-19 DIAGNOSIS — B351 Tinea unguium: Secondary | ICD-10-CM | POA: Diagnosis not present

## 2022-06-19 NOTE — Progress Notes (Signed)
ANNUAL DIABETIC FOOT EXAM  Subjective: Morgan [name] presents today for annual diabetic foot examination.  Chief Complaint  Patient presents with   Nail Problem    Diabetic foot care BS-did not check today A1C-do not know PCP-Morgan [name] PCP VST- May 2023    Patient confirms h/o diabetes.  Patient denies any h/o foot wounds.  Risk factors: diabetes, h/o TIA, HTN, hyperlipidemia, diabetic renal disease.  Morgan Sauer, MD is patient's PCP. Last visit was Dec 12, 2021.  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 11/23/2021   TIA (transient ischemic attack) 11/23/2021   Chronic pain 09/14/2021   Memory loss 09/14/2021   Pseudogout involving multiple joints 01/30/2021   Pure hypercholesterolemia 01/30/2021   Type 2 diabetes mellitus without complications ([place]) 16/96/7893   Vitamin D deficiency 01/30/2021   Fall 10/19/2020   Fall at home, initial encounter 10/18/2020   Hypothyroidism    Hypertension    Hyperlipidemia    History of TIA (transient ischemic attack)    Chronic kidney disease (CKD), stage III (moderate) (HCC)    Allergic rhinitis 10/11/2020   Chronic kidney disease due to hypertension 10/11/2020   Diabetic renal disease ([place]) 10/11/2020   Hyperlipidemia 10/11/2020   Hypothyroidism 10/11/2020   Kyphosis 10/11/2020   Osteoarthritis 10/11/2020   Personal history of transient ischemic attack  (TIA), and cerebral infarction without residual deficits 10/11/2020   Zenker's (hypopharyngeal) diverticulum 02/25/2016   Pharyngoesophageal dysphagia 12/06/2015   Anemia, unspecified 06/13/2013   Osteopenia 04/18/2012   History of breast cancer 12/11/2011   Past Surgical History:  Procedure Laterality Date   CHOLECYSTECTOMY  1978?   EYE SURGERY Bilateral    cataracts   MASTECTOMY  1989/2005   SQUAMOUS CELL CARCINOMA EXCISION     ZENKER'S DIVERTICULECTOMY ENDOSCOPIC N/A 02/25/2016   Procedure: ENDOSCOPIC ZENKER'S  DIVERTICULECTOMY;  Surgeon: Izora Gala, MD;  Location: [place];  Service: ENT;  Laterality: N/A;   ZENKER'S DIVERTICULECTOMY ENDOSCOPIC  02/25/2016   Current Outpatient Medications on File Prior to Visit  Medication Sig Dispense Refill   losartan (COZAAR) 50 MG tablet Take by mouth.     Calcium Carb-Cholecalciferol (CALCIUM-VITAMIN D) 600-400 MG-UNIT TABS Take 1-2 tablets by mouth 2 (two) times daily. 1 tablet in the morning, 2 tablets in the evening     CALCIUM PO Take 1 tablet by mouth daily.     clopidogrel (PLAVIX) 75 MG tablet Take 75 mg by mouth daily.       clopidogrel (PLAVIX) 75 MG tablet Take 75 mg by mouth daily.     fluticasone (FLONASE) 50 MCG/ACT nasal spray Place 1 spray into both nostrils daily.     glucose blood (ONETOUCH ULTRA) test strip See admin instructions.     HYDROcodone-acetaminophen (NORCO) 7.5-325 MG tablet Take 1 tablet by mouth every 6 (  six) hours as needed for moderate pain. 10 tablet 0   Lancets (ONETOUCH DELICA PLUS [id number]) MISC USE AS DIRECTED TO TEST BLOOD SUGAR ONCE A DAY     levothyroxine (SYNTHROID) 100 MCG tablet Take 100 mcg by mouth daily.     levothyroxine (SYNTHROID) 100 MCG tablet Take 1 tablet by mouth daily.     levothyroxine (SYNTHROID) 112 MCG tablet Take 112 mcg by mouth daily.     levothyroxine (SYNTHROID, LEVOTHROID) 88 MCG tablet Take 88 mcg by mouth daily.       metoprolol tartrate (LOPRESSOR) 25 MG tablet Take 12.5 mg  by mouth 2 (two) times daily.      Multiple Vitamin (MULTIVITAMIN WITH MINERALS) TABS tablet Take 1 tablet by mouth daily.     Multiple Vitamin (MULTIVITAMIN) tablet Take 1 tablet by mouth daily.     promethazine (PHENERGAN) 25 MG suppository Place 1 suppository (25 mg total) rectally every 6 (six) hours as needed for nausea or vomiting. 12 suppository 1   simvastatin (ZOCOR) 10 MG tablet Take 10 mg by mouth at bedtime.     simvastatin (ZOCOR) 10 MG tablet Take 10 mg by mouth at bedtime.     No current facility-administered medications on file prior to visit.    Allergies  Allergen Reactions   Chocolate     Other reaction(s): Unknown   Iodinated Contrast Media     Other reaction(s): Unknown   Other Swelling    Strawberries & chocolate ?- Facial swelling.   Shellfish Allergy Swelling    Shrimp & flounder-face swells on left side. Other reaction(s): Unknown   Shrimp (Diagnostic)     Other reaction(s): Unknown   Strawberry (Diagnostic)     Other reaction(s): Unknown   Strawberry Extract Hives    Other reaction(s): Unknown   Social History   Occupational History   Not on file  Tobacco Use   Smoking status: Never   Smokeless tobacco: Never  Substance and Sexual Activity   Alcohol use: Yes    Comment: Occasional glass of wine   Drug use: Never   Sexual activity: Not on file   Family History  Problem Relation Age of Onset   Breast cancer Mother    Heart disease Father    Diabetes Father    Cancer Mother        breast   Heart disease Brother    Immunization History  Administered Date(s) Administered   Influenza-Unspecified 05/24/2011, 05/15/2012   Tdap 10/18/2020     Review of Systems: Negative except as noted in the HPI.   Objective: There were no vitals filed for this visit.  Morgan [name] is a pleasant 88 y.o. female in NAD. AAO X 3.  Vascular Examination: CFT <4 seconds b/l. DP pulses faintly palpable b/l. PT pulses nonpalpable b/l. Digital hair absent.  Skin temperature gradient warm to warm b/l. No pain with calf compression. No ischemia or gangrene. No cyanosis or clubbing noted b/l.    Neurological Examination: Sensation grossly intact b/l with 10 gram monofilament. Vibratory sensation intact b/l.   Dermatological Examination: Pedal skin warm and supple b/l. Toenails 1-5 b/l thick, discolored, elongated with subungual debris and pain on dorsal palpation.  No open wounds b/l LE. No interdigital macerations noted b/l LE.  Musculoskeletal Examination: Muscle strength 5/5 to b/l LE. HAV with bunion deformity noted b/l LE. Hammertoe deformity noted 2-5 b/l.  Radiographs: None  Footwear Assessment: Does the patient wear appropriate shoes? Yes. Does the patient need inserts/orthotics?  Yes.  ADA Risk Categorization:  High Risk  Patient has one or more of the following: Loss of protective sensation Absent pedal pulses Severe Foot deformity History of foot ulcer  Assessment: 1. Pain due to onychomycosis of toenails of both feet   2. Corns and callosities   3. Acquired hammertoes of both feet   4. Type II diabetes mellitus with peripheral circulatory disorder (HCC)   5. Encounter for diabetic foot exam ([place])     Plan: -Consent given for treatment as described below: -Examined patient. -Diabetic foot examination performed today. -Continue diabetic foot care principles: inspect feet daily, monitor glucose as recommended by PCP and/or Endocrinologist, and follow prescribed diet per PCP, Endocrinologist and/or dietician. -Toenails 1-5 b/l were debrided in length and girth with sterile nail nippers and dremel without iatrogenic bleeding.  -Corn(s) right fifth digit pared utilizing sharp debridement with sterile blade without complication or incident. Total number debrided=1. -Callus(es) submet head 1 b/l pared utilizing sterile scalpel blade without complication or incident. Total number debrided =2. -Patient/POA to call should there be  question/concern in the interim. Return in about 3 months (around 09/19/2022).  Marzetta Board, DPM

## 2022-06-24 ENCOUNTER — Encounter: Payer: Self-pay | Admitting: Podiatry

## 2022-10-13 ENCOUNTER — Ambulatory Visit: Payer: Medicare Other | Admitting: Podiatry

## 2022-10-23 ENCOUNTER — Encounter: Payer: Self-pay | Admitting: Podiatry

## 2022-10-23 ENCOUNTER — Ambulatory Visit (INDEPENDENT_AMBULATORY_CARE_PROVIDER_SITE_OTHER): Payer: Medicare Other | Admitting: Podiatry

## 2022-10-23 DIAGNOSIS — E1151 Type 2 diabetes mellitus with diabetic peripheral angiopathy without gangrene: Secondary | ICD-10-CM | POA: Diagnosis not present

## 2022-10-23 DIAGNOSIS — M79674 Pain in right toe(s): Secondary | ICD-10-CM

## 2022-10-23 DIAGNOSIS — M79675 Pain in left toe(s): Secondary | ICD-10-CM

## 2022-10-23 DIAGNOSIS — B351 Tinea unguium: Secondary | ICD-10-CM | POA: Diagnosis not present

## 2022-10-23 NOTE — Progress Notes (Signed)
ANNUAL DIABETIC FOOT EXAM  Subjective: Morgan [name] presents today for annual diabetic foot examination.  Chief Complaint  Patient presents with   Nail Problem    Nail trim     Patient confirms h/o diabetes.  Patient denies any h/o foot wounds.  Risk factors: diabetes, h/o TIA, HTN, hyperlipidemia, diabetic renal disease.  Cathlean Sauer, MD is patient's PCP. Last visit was Dec 12, 2021.  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 11/23/2021   TIA (transient ischemic attack) 11/23/2021   Chronic pain 09/14/2021   Memory loss 09/14/2021   Pseudogout involving multiple joints 01/30/2021   Pure hypercholesterolemia 01/30/2021   Type 2 diabetes mellitus without complications ([place]) [id number]   Vitamin D deficiency 01/30/2021   Fall 10/19/2020   Fall at home, initial encounter 10/18/2020   Hypothyroidism    Hypertension    Hyperlipidemia    History of TIA (transient ischemic attack)    Chronic kidney disease (CKD), stage III (moderate) (HCC)    Allergic rhinitis 10/11/2020   Chronic kidney disease due to hypertension 10/11/2020   Diabetic renal disease ([place]) 10/11/2020   Hyperlipidemia 10/11/2020   Hypothyroidism 10/11/2020   Kyphosis 10/11/2020   Osteoarthritis 10/11/2020   Personal history of transient ischemic attack (TIA), and cerebral infarction without residual deficits 10/11/2020   Zenker's  (hypopharyngeal) diverticulum 02/25/2016   Pharyngoesophageal dysphagia 12/06/2015   Anemia, unspecified 06/13/2013   Osteopenia 04/18/2012   History of breast cancer 12/11/2011   Past Surgical History:  Procedure Laterality Date   CHOLECYSTECTOMY  1978?   EYE SURGERY Bilateral    cataracts   MASTECTOMY  1989/2005   SQUAMOUS CELL CARCINOMA EXCISION     ZENKER'S DIVERTICULECTOMY ENDOSCOPIC N/A 02/25/2016   Procedure: ENDOSCOPIC ZENKER'S  DIVERTICULECTOMY;  Surgeon: Izora Gala, MD;  Location: [place];  Service: ENT;  Laterality: N/A;   ZENKER'S DIVERTICULECTOMY ENDOSCOPIC  02/25/2016   Current Outpatient Medications on File Prior to Visit  Medication Sig Dispense Refill   Calcium Carb-Cholecalciferol (CALCIUM-VITAMIN D) 600-400 MG-UNIT TABS Take 1-2 tablets by mouth 2 (two) times daily. 1 tablet in the morning, 2 tablets in the evening     CALCIUM PO Take 1 tablet by mouth daily.     clopidogrel (PLAVIX) 75 MG tablet Take 75 mg by mouth daily.       clopidogrel (PLAVIX) 75 MG tablet Take 75 mg by mouth daily.     fluticasone (FLONASE) 50 MCG/ACT nasal spray Place 1 spray into both nostrils daily.     glucose blood (ONETOUCH ULTRA) test strip See admin instructions.     HYDROcodone-acetaminophen (NORCO) 7.5-325 MG tablet Take 1 tablet by mouth every 6 (six) hours as needed for moderate pain. 10 tablet 0   Lancets (ONETOUCH DELICA PLUS 123XX123) MISC USE AS DIRECTED TO TEST BLOOD SUGAR  ONCE A DAY     levothyroxine (SYNTHROID) 100 MCG tablet Take 100 mcg by mouth daily.     levothyroxine (SYNTHROID) 100 MCG tablet Take 1 tablet by mouth daily.     levothyroxine (SYNTHROID) 112 MCG tablet Take 112 mcg by mouth daily.     levothyroxine (SYNTHROID, LEVOTHROID) 88 MCG tablet Take 88 mcg by mouth daily.       losartan (COZAAR) 50 MG tablet Take by mouth.     metoprolol tartrate (LOPRESSOR) 25 MG tablet Take 12.5 mg by mouth 2 (two) times daily.      Multiple Vitamin (MULTIVITAMIN WITH MINERALS)  TABS tablet Take 1 tablet by mouth daily.     Multiple Vitamin (MULTIVITAMIN) tablet Take 1 tablet by mouth daily.     promethazine (PHENERGAN) 25 MG suppository Place 1 suppository (25 mg total) rectally every 6 (six) hours as needed for nausea or vomiting. 12 suppository 1   simvastatin (ZOCOR) 10 MG tablet Take 10 mg by mouth at bedtime.     simvastatin (ZOCOR) 10 MG tablet Take 10 mg by mouth at bedtime.     No current facility-administered medications on file prior to visit.    Allergies  Allergen Reactions   Chocolate     Other reaction(s): Unknown   Iodinated Contrast Media     Other reaction(s): Unknown   Other Swelling    Strawberries & chocolate ?- Facial swelling.   Shellfish Allergy Swelling    Shrimp & flounder-face swells on left side. Other reaction(s): Unknown   Shrimp (Diagnostic)     Other reaction(s): Unknown   Strawberry (Diagnostic)     Other reaction(s): Unknown   Strawberry Extract Hives    Other reaction(s): Unknown   Social History   Occupational History   Not on file  Tobacco Use   Smoking status: Never   Smokeless tobacco: Never  Substance and Sexual Activity   Alcohol use: Yes    Comment: Occasional glass of wine   Drug use: Never   Sexual activity: Not on file   Family History  Problem Relation Age of Onset   Breast cancer Mother    Heart disease Father    Diabetes Father    Cancer Mother        breast   Heart disease Brother    Immunization History  Administered Date(s) Administered   Influenza-Unspecified 05/24/2011, 05/15/2012   Tdap 10/18/2020     Review of Systems: Negative except as noted in the HPI.   Objective: There were no vitals filed for this visit.  Morgan [name] is a pleasant 89 y.o. female in NAD. AAO X 3.  Vascular Examination: CFT <4 seconds b/l. DP pulses faintly palpable b/l. PT pulses nonpalpable b/l. Digital hair absent. Skin temperature gradient warm to warm b/l. No pain with calf compression. No  ischemia or gangrene. No cyanosis or clubbing noted b/l.    Neurological Examination: Sensation grossly intact b/l with 10 gram monofilament. Vibratory sensation intact b/l.   Dermatological Examination: Pedal skin warm and supple b/l. Toenails 1-5 b/l thick, discolored, elongated with subungual debris and pain on dorsal palpation.  No open wounds b/l LE. No interdigital macerations noted b/l LE.  Musculoskeletal Examination: Muscle strength 5/5 to b/l LE. HAV with bunion deformity noted b/l LE. Hammertoe deformity noted 2-5 b/l.  Radiographs: None  Footwear Assessment: Does the patient wear appropriate shoes? Yes. Does the patient need inserts/orthotics? Yes.  ADA Risk Categorization:  High Risk  Patient has one or  more of the following: Loss of protective sensation Absent pedal pulses Severe Foot deformity History of foot ulcer  Assessment: 1. Pain due to onychomycosis of toenails of both feet   2. Type II diabetes mellitus with peripheral circulatory disorder (HCC)     Plan: -Consent given for treatment as described below: -Examined patient. -Diabetic foot examination performed today. -Continue diabetic foot care principles: inspect feet daily, monitor glucose as recommended by PCP and/or Endocrinologist, and follow prescribed diet per PCP, Endocrinologist and/or dietician. -Toenails 1-5 b/l were debrided in length and girth with sterile nail nippers and dremel without iatrogenic bleeding.  -Corn(s) right fifth digit pared utilizing sharp debridement with sterile blade without complication or incident. Total number debrided=1. -Callus(es) submet head 1 b/l pared utilizing sterile scalpel blade without complication or incident. Total number debrided =2. -Patient/POA to call should there be question/concern in the interim. No follow-ups on file.  Lorenda Peck, DPM

## 2023-01-23 ENCOUNTER — Encounter: Payer: Self-pay | Admitting: Podiatry

## 2023-01-23 ENCOUNTER — Ambulatory Visit (INDEPENDENT_AMBULATORY_CARE_PROVIDER_SITE_OTHER): Payer: Medicare Other | Admitting: Podiatry

## 2023-01-23 DIAGNOSIS — L84 Corns and callosities: Secondary | ICD-10-CM | POA: Diagnosis not present

## 2023-01-23 DIAGNOSIS — M79674 Pain in right toe(s): Secondary | ICD-10-CM | POA: Diagnosis not present

## 2023-01-23 DIAGNOSIS — M79675 Pain in left toe(s): Secondary | ICD-10-CM

## 2023-01-23 DIAGNOSIS — E1151 Type 2 diabetes mellitus with diabetic peripheral angiopathy without gangrene: Secondary | ICD-10-CM | POA: Diagnosis not present

## 2023-01-23 DIAGNOSIS — B351 Tinea unguium: Secondary | ICD-10-CM

## 2023-01-23 NOTE — Progress Notes (Signed)
  Subjective:  Patient ID: Morgan [name], female    DOB: [birth year],   MRN: [id number]  Chief Complaint  Patient presents with   Nail Problem    Routine foot care    89 y.o. female presents for concern of thickened elongated and painful nails that are difficult to trim. Requesting to have them trimmed today. Relates burning and tingling in their feet. Patient is diabetic and last A1c was  Lab Results  Component Value Date   HGBA1C 5.6 02/18/2016   .   PCP:  Herma Carson, MD    . Denies any other pedal complaints. Denies n/v/f/c.   Past Medical History:  Diagnosis Date   Arthritis    Cancer (HCC)    breast bilateral   Chronic kidney disease (CKD), stage III (moderate) (HCC)    Complication of chemotherapy    Diabetes mellitus without complication (HCC)    medication stiopped 5 months ago  checks CBG 2 times monthly   Dysrhythmia    Hearing loss    History of TIA (transient ischemic attack)    Hyperlipidemia    Hypertension    Hypothyroidism    S/P radiation therapy    Stroke (HCC)    TIA   Thyroid disease    hypothyroidism    Objective:  Physical Exam: Vascular Examination: CFT <4 seconds b/l. DP pulses faintly palpable b/l. PT pulses nonpalpable b/l. Digital hair absent. Skin temperature gradient warm to warm b/l. No pain with calf compression. No ischemia or gangrene. No cyanosis or clubbing noted b/l.     Neurological Examination: Sensation grossly intact b/l with 10 gram monofilament. Vibratory sensation intact b/l.    Dermatological Examination: Pedal skin warm and supple b/l. Toenails 1-5 b/l thick, discolored, elongated with subungual debris and pain on dorsal palpation.  No open wounds b/l LE. No interdigital macerations noted b/l LE.   Musculoskeletal Examination: Muscle strength 5/5 to b/l LE. HAV with bunion deformity noted b/l LE. Hammertoe deformity noted 2-5 b/l.   Radiographs: None  Assessment:   1. Pain due to onychomycosis of toenails of  both feet   2. Type II diabetes mellitus with peripheral circulatory disorder (HCC)   3. Corns and callosities      Plan:  Patient was evaluated and treated and all questions answered. -Discussed and educated patient on diabetic foot care, especially with  regards to the vascular, neurological and musculoskeletal systems.  -Stressed the importance of good glycemic control and the detriment of not  controlling glucose levels in relation to the foot. -Discussed supportive shoes at all times and checking feet regularly.  -Mechanically debrided all nails 1-5 bilateral using sterile nail nipper and filed with dremel without incident  -Answered all patient questions -Patient to return  in 3 months for at risk foot care -Patient advised to call the office if any problems or questions arise in the meantime.   Louann Sjogren, DPM

## 2023-03-08 DEATH — deceased

## 2023-04-25 ENCOUNTER — Ambulatory Visit: Payer: Medicare Other | Admitting: Podiatry
